# Patient Record
Sex: Male | Born: 1940 | Race: Black or African American | Hispanic: No | Marital: Married | Smoking: Never smoker
Health system: Southern US, Community
[De-identification: ages and names within clinical notes are randomized; demographics above are authoritative.]

## PROBLEM LIST (undated history)

## (undated) DIAGNOSIS — F419 Anxiety disorder, unspecified: Secondary | ICD-10-CM

## (undated) DIAGNOSIS — G629 Polyneuropathy, unspecified: Secondary | ICD-10-CM

## (undated) DIAGNOSIS — K219 Gastro-esophageal reflux disease without esophagitis: Secondary | ICD-10-CM

## (undated) DIAGNOSIS — H269 Unspecified cataract: Secondary | ICD-10-CM

## (undated) DIAGNOSIS — H6690 Otitis media, unspecified, unspecified ear: Secondary | ICD-10-CM

## (undated) DIAGNOSIS — Z860100 Personal history of colon polyps, unspecified: Secondary | ICD-10-CM

## (undated) DIAGNOSIS — E119 Type 2 diabetes mellitus without complications: Secondary | ICD-10-CM

## (undated) DIAGNOSIS — G4700 Insomnia, unspecified: Secondary | ICD-10-CM

## (undated) DIAGNOSIS — M1990 Unspecified osteoarthritis, unspecified site: Secondary | ICD-10-CM

## (undated) DIAGNOSIS — I10 Essential (primary) hypertension: Secondary | ICD-10-CM

## (undated) DIAGNOSIS — M2550 Pain in unspecified joint: Secondary | ICD-10-CM

## (undated) DIAGNOSIS — M2540 Effusion, unspecified joint: Secondary | ICD-10-CM

## (undated) DIAGNOSIS — E785 Hyperlipidemia, unspecified: Secondary | ICD-10-CM

## (undated) HISTORY — PX: OTHER SURGICAL HISTORY: SHX169

## (undated) HISTORY — PX: COLONOSCOPY WITH ESOPHAGOGASTRODUODENOSCOPY (EGD) AND ESOPHAGEAL DILATION (ED): SHX6495

## (undated) HISTORY — PX: COLONOSCOPY: SHX174

## (undated) HISTORY — PX: HERNIA REPAIR: SHX51

## (undated) HISTORY — PX: REPLACEMENT TOTAL KNEE: SUR1224

## (undated) HISTORY — PX: TONSILLECTOMY: SUR1361

---

## 2006-10-03 ENCOUNTER — Inpatient Hospital Stay (HOSPITAL_COMMUNITY): Admission: RE | Admit: 2006-10-03 | Discharge: 2006-10-06 | Payer: Self-pay | Admitting: Orthopedic Surgery

## 2006-11-28 ENCOUNTER — Encounter: Admission: RE | Admit: 2006-11-28 | Discharge: 2006-11-28 | Payer: Self-pay | Admitting: Gastroenterology

## 2008-09-17 ENCOUNTER — Emergency Department (HOSPITAL_COMMUNITY): Admission: EM | Admit: 2008-09-17 | Discharge: 2008-09-17 | Payer: Self-pay | Admitting: Emergency Medicine

## 2008-10-04 IMAGING — CR DG CHEST 1V PORT
1 series · 1 of 1 positions shown · non-contrast
Comparison: None.

Exam: Chest, one view.

HISTORY: Rule out heart failure.

[view not recorded]
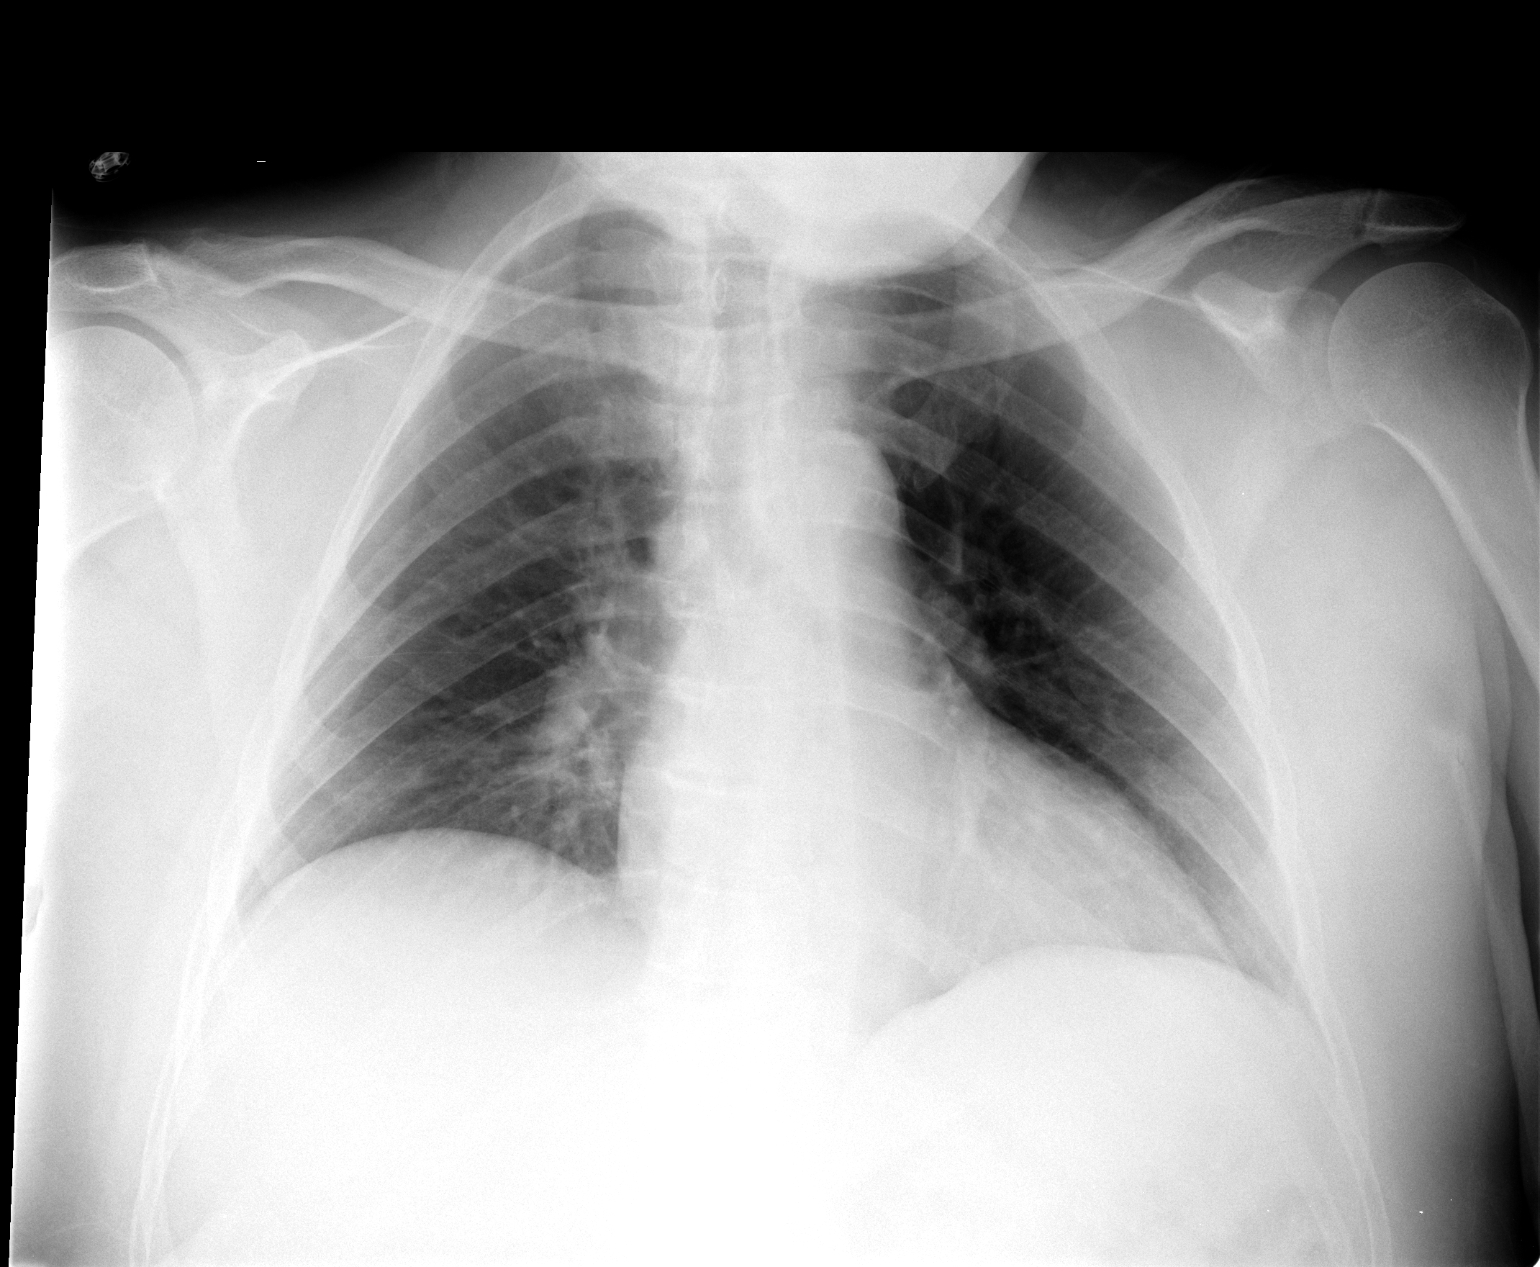

[1 of 1 positions shown; findings below may reference images not displayed]

FINDINGS: Heart size is normal.

There is no pleural fluid or pulmonary edema.

No airspace opacities are identified.
IMPRESSION: 1. No active disease.

## 2010-07-05 NOTE — H&P (Signed)
NAME:  Donald Hernandez, Donald Hernandez NO.:  [id number]   MEDICAL RECORD NO.:  [id number]         PATIENT TYPE:  LINP   LOCATION:                               FACILITY:  [facility]   PHYSICIAN:  Ollen Gross, M.D.    DATE OF BIRTH:  [birth year]   DATE OF ADMISSION:  10/03/2006  DATE OF DISCHARGE:                              HISTORY & PHYSICAL   CHIEF COMPLAINT:  Left knee pain and dysfunction.   HISTORY OF PRESENT ILLNESS:  This patient is a 70 year old male who has  been seen by Dr. Lequita Halt for ongoing left knee pain.  He has known  arthritis.  It is more dysfunction than it is pain.  The knee is quite  stiff, and it is preventing him from doing the things that he enjoys. He  has been on anti-inflammatories in the past.  He has also completed a  series of Synvisc; unfortunately did not get good results.  He has  reached a point where he would like to have something more permanent  done about his knee.  Risks and benefits of procedure have been  discussed with the patient, and he elects to proceed with surgery.   ALLERGIES:  No known drug allergies.   CURRENT MEDICATIONS:  Crestor, occasional BC powder.   PAST MEDICAL HISTORY:  Hypercholesterolemia, benign tumor of the  prostate, arthritis.   PAST SURGICAL HISTORY:  Past surgical history of hernia during his  childhood years, left fourth finger amputation surgery.  Also multiple  left finger surgeries with index of fifth finger skin grafting,  outpatient prostate surgery, also colonoscopy.   SOCIAL HISTORY:  Married, retired, nonsmoker.  No alcohol.  Four  children.   FAMILY HISTORY:  Father, mother with diabetes.  Family history of also  heart disease.   REVIEW OF SYSTEMS:  GENERAL:  No fevers, chills, night sweats.  NEUROLOGICAL:  No seizures, syncope or paralysis.  RESPIRATORY:  No  shortness of breath, productive cough or hemoptysis.  CARDIOVASCULAR:  She had no chest pain, angina, orthopnea.  GI:  No nausea,  vomiting,  diarrhea, constipation.  GU:  A little bit of weak stream, no dysuria or  hematuria.  MUSCULOSKELETAL:  Left knee.   PHYSICAL EXAMINATION:  VITAL SIGNS:  Pulse 68, respirations 12, blood  pressure 160/86.  GENERAL:  A 70 year old African-American male well-nourished, well-  developed in no acute distress.  Good historian.  He is accompanied by  his wife.  HEENT:  Normocephalic, atraumatic.  Pupils are round, reactive.  Oropharynx clear.  EOMs intact.  NECK:  Supple.  CHEST:  Clear.  HEART:  Regular rate and rhythm.  No murmur, S1, S2 noted.  ABDOMEN:  Soft, nontender.  Bowel sounds present.  BREASTS/GENITALIA:  Not done and not pertinent to present illness.  EXTREMITIES:  Left knee marked crepitus noted on passive range of  motion.  Range of motion 10-115, no instability.   IMPRESSION:  1. Osteoarthritis of left knee.  2. Hypercholesterolemia.  3. Benign tumor of the prostate.  4. Arthritis.   PLAN:  The patient admitted to [facility] to undergo  a left  total knee replacement arthroplasty.  Surgery will be performed by Dr.  Ollen Gross.      Alexzandrew L. Perkins, P.A.C.      Ollen Gross, M.D.  Electronically Signed    ALP/MEDQ  D:  09/25/2006  T:  09/25/2006  Job:  [id number]   cc:   Ollen Gross, M.D.  Fax: [phone]   Elana Alm. Nicholos Johns, M.D.  Fax: [phone]

## 2010-07-05 NOTE — Op Note (Signed)
NAME:  Donald Hernandez, GROUND NO.:  [id number]   MEDICAL RECORD NO.:  [id number]          PATIENT TYPE:  INP   LOCATION:  0004                         FACILITY:  [facility]   PHYSICIAN:  Ollen Gross, M.D.    DATE OF BIRTH:  [birth year]   DATE OF PROCEDURE:  10/03/2006  DATE OF DISCHARGE:                               OPERATIVE REPORT   PREOPERATIVE DIAGNOSIS:  Osteoarthritis left knee.   POSTOPERATIVE DIAGNOSIS:  Osteoarthritis left knee.   PROCEDURE:  Left total knee arthroplasty.   SURGEON:  Ollen Gross, M.D.   ASSISTANT:  Alexzandrew L. Perkins, P.A.C.   ANESTHESIA:  Spinal.   ESTIMATED BLOOD LOSS:  Minimal.   DRAINS:  None.   TOURNIQUET TIME:  36 minutes at 300 mmHg.   COMPLICATIONS:  None.   CONDITION:  Stable to recovery room.   CLINICAL NOTE:  Mr. Mcminn is a 70 year old male who has end stage  arthritis of his left knee with progressively worsening pain and  dysfunction.  He has failed nonoperative management including injections  and presents now for total knee arthroplasty.   PROCEDURE IN DETAIL:  After the successful administration of spinal  anesthetic, a tourniquet was placed high on the left thigh and the left  lower extremity was prepped and draped in the usual sterile fashion.  The extremity was wrapped in an Esmarch, knee flexed, tourniquet  inflated to 300 mmHg.  A midline incision was made with a 10 blade  through subcutaneous tissue to the level of the extensor mechanism.  A  fresh blade was used make a medial parapatellar arthrotomy.  Soft tissue  over the proximal medial tibia is subperiosteally elevated to the joint  line with the knife and into the semimembranosus bursa with a Cobb  elevator.  Soft tissue laterally is elevated with attention being paid  to avoid the patellar tendon on the tibial tubercle.  The patella was  subluxed laterally and knee flexed 90 degrees, ACL and PCL are removed.  A drill was used to create a starting  hole in the distal femur, the  canal was thoroughly irrigated.  A 5 degree left valgus alignment guide  is placed referencing off the posterior condyles. Rotation is marked and  the block pinned to remove 11 mm of the distal femur.  I took 11 mm  because of a preop flexion contracture.  Distal femoral resection is  made with an oscillating saw.  Sizing blocks is placed, size 4 is most  appropriate.  Rotation is marked off the epicondylar axis, size 4  cutting block is placed, and then the anterior, posterior, and chamfer  cuts were made.   The tibia was subluxed forward and the menisci were removed.  Extramedullary tibial alignment guide was placed referencing proximally  at the medial aspect of the tibial tubercle and distally along the  second metatarsal axis and tibial crest.  Blocks were pinned to remove  10 mm off the non-deficient lateral side.  This barely got anything off  the medial side so I took an additional 2 mm.  Tibial resection is made  with an  oscillating saw.  Size 4 is the most appropriate tibial  component and the proximal tibia is prepared with the modular drill and  keel punch for a size 4.  Femoral preparation is completed with the  intercondylar cut.   Size 4 mobile bearing tibial trial, size 4 posterior stabilized femoral  trial, and a 12.5 mm posterior stabilized rotating platform insert trial  are placed.  With the 12.5, full extension was achieved with excellent  varus and valgus balance throughout full range of motion.  The patella  was everted and thickness measured to be 22 mm.  Freehand resection was  taken to 33 mm, 38 template is placed, lug holes were drilled, trial  patella was placed and it tracks normally.  Osteophytes were removed off  the posterior femur with the trial in place.  All trials were removed  and the cut bone surfaces are prepared with pulsatile lavage.  The  cement was mixed and once ready for implantation, the size 3 mobile  bearing  tibial tray, size 3 posterior stabilized femur, and 38 patella  are cemented into place.  The patella was held with a clamp.  The trial  12.5 insert is placed, the knee held in full extension, and all extruded  cement removed.  While the cement was hardening, the synovium was  removed.  The wound was copiously irrigated with saline solution and  FloSeal placed on the posterior capsule as well as the medial and  lateral gutters and suprapatellar area.  The trial insert was removed  and the permanent 12.5 mm posterior stabilized rotating platform insert  is placed into the tibial tray.  The wound is copiously irrigated with  saline again and the tourniquet released for a total time of 36 minutes.  A moist sponge is placed and after two minutes removed, and there is  minimal if any bleeding.  Any bleeding encountered is stopped with  electrocautery.  The extensor mechanism is then closed with interrupted  #1 PDS.  Flexion against gravity to 135 degrees.  Subcu was closed with  interrupted 2-0 Vicryl, subcuticular running 4-0 Monocryl.  The incision  is cleaned and dried and Steri-Strips and a bulky sterile dressing  applied.  He was then placed into a knee immobilizer, awakened, and  transferred to recovery in stable condition.      Ollen Gross, M.D.  Electronically Signed     FA/MEDQ  D:  10/03/2006  T:  10/04/2006  Job:  [id number]

## 2010-07-08 NOTE — Discharge Summary (Signed)
NAME:  Donald Hernandez, Donald Hernandez NO.:  [id number]   MEDICAL RECORD NO.:  [id number]          PATIENT TYPE:  INP   LOCATION:  1617                         FACILITY:  [facility]   PHYSICIAN:  Ollen Gross, M.D.    DATE OF BIRTH:  18-Apr-1940   DATE OF ADMISSION:  10/03/2006  DATE OF DISCHARGE:  10/06/2006                               DISCHARGE SUMMARY   ADMISSION DIAGNOSES:  1. Osteoarthritis left knee.  2. Hypercholesterolemia.  3. Benign tumor the prostate.  4. Arthritis.   DISCHARGE DIAGNOSIS:  1. Osteoarthritis left knee, status post left total knee replacement      arthroplasty.  2. Mild postop blood loss anemia.  Did not require transfusion.  3. Hypercholesterolemia.  4. Benign tumor the prostate.  5. Arthritis.   PROCEDURE:  October 03, 2006 left total knee.  Surgeon Dr. Lequita Halt,  assistant Julien Girt PA-C.  Spinal anesthesia.   CONSULTATIONS:  None.   BRIEF HISTORY:  Donald Hernandez is a 70 year old male with end-stage  arthritis of left knee, with progressive worsening pain and dysfunction,  who has failed non-operative management  including injections, now  presents for total knee.   LABORATORY DATA:  Preop CBC showed hemoglobin 14.9, hematocrit of 44.2,  white cell count 6.6.  Postop hemoglobin 12.8, drifted down to 11.6.  Last H&H 10.7 and 31.5.  PT/PTT preop 12.9 and 29 respectively.  INR  1.0.  Serial protimes followed.  PT/INR 22.4, 1.9.  Chem panel on  admission all within normal limits with the exception of minimally  elevated ALP of 119.  Serial BMPs were followed.  Electrolytes remained  within normal limits.  Preop UA negative.  Postop UA:  Large hemoglobin,  moderate leukocyte esterase, only rare epithelials, 7-10 white cells, 36  red cells and few bacteria.  Urine culture no growth, negative.  EKG,  September 28, 2006 normal sinus rhythm, normal EKG, no previous tracings,  confirmed by Dr. Lewayne Bunting.   Chest X-Ray: October 04, 2006 no active disease.   HOSPITAL COURSE:  Admitted to [facility], tolerated procedure  well.  Later transferred to the orthopedic floor.  Started on PCA and  p.o. analgesics pain control following surgery and 24 hours postop IV  antibiotics.  Also started on Coumadin for DVT prophylaxis.   Actually doing pretty well on the morning of day one, hardly had any  pain.  He had a little bit of nausea and vomiting early a.m., but this  actually had resolved and doing better on morning rounds.  His urine  output was monitored.  He had decent output.  This was followed closely.  Hemoglobin looked good.  He started getting out of bed by day two.  Dressing was changed.  Incision looked good.  Output was excellent.  He  had had a little bit of elevated temperature, therefore chest x-ray and  UA was checked.  Chest x-ray did not show any active disease.  Blood  started some initial signs of a UTI, so we started on Cipro.  From a  therapy standpoint, he was doing well, walking about 150 feet.  By  day  three, he was doing well.  He still had a little bit of tinting and had  started on Cipro.  Encouraged incentive spirometer.  He was doing well  from a therapy standpoint, walking 300 feet.  He was on antibiotics for  the initial findings for UTI and was discharged home.   DISCHARGE/PLAN:  1. Discharged home on October 06, 2006.  2. Discharge diagnoses please see above.  3. Discharge meds:  Percocet, Robaxin, Cipro, Coumadin.  4. Diet:  Resume home diet, heart-healthy diet.  5. Activity:  Weightbearing as tolerated with home health PT, home      health nursing, total knee protocol.  Follow-up 2 weeks from      surgery with Dr. Lequita Halt   DISPOSITION:  Home.   CONDITION ON DISCHARGE:  Improving.      Donald Hernandez, P.A.C.      Ollen Gross, M.D.  Electronically Signed    ALP/MEDQ  D:  10/25/2006  T:  10/25/2006  Job:  [id number]   cc:   Ollen Gross, M.D.  Fax: [phone]

## 2010-12-02 LAB — CBC
HCT: 31.5 — ABNORMAL LOW
HCT: 33.3 — ABNORMAL LOW
Hemoglobin: 10.7 — ABNORMAL LOW
MCHC: 34
MCV: 85.8
Platelets: 190
RBC: 3.68 — ABNORMAL LOW
RBC: 3.88 — ABNORMAL LOW
RDW: 12.5

## 2010-12-02 LAB — BASIC METABOLIC PANEL
CO2: 26
Chloride: 107
Creatinine, Ser: 1.02
Glucose, Bld: 122 — ABNORMAL HIGH
Potassium: 4.1
Sodium: 139

## 2010-12-02 LAB — PROTIME-INR
INR: 1.9 — ABNORMAL HIGH
Prothrombin Time: 22.4 — ABNORMAL HIGH

## 2010-12-05 LAB — CBC
HCT: 37.6 — ABNORMAL LOW
HCT: 44.2
Hemoglobin: 12.8 — ABNORMAL LOW
Hemoglobin: 14.9
MCHC: 33.8
MCHC: 34.1
MCV: 85.8
Platelets: 216
Platelets: 244
RBC: 4.38
RBC: 5.18
RDW: 13.7
WBC: 6.6
WBC: 8.4

## 2010-12-05 LAB — COMPREHENSIVE METABOLIC PANEL
AST: 25
Albumin: 3.6
CO2: 27
GFR calc Af Amer: >60
GFR calc non Af Amer: 59 — ABNORMAL LOW
Sodium: 138
Total Bilirubin: 0.7

## 2010-12-05 LAB — PROTIME-INR
Prothrombin Time: 12.9
Prothrombin Time: 13.4

## 2010-12-05 LAB — URINALYSIS, ROUTINE W REFLEX MICROSCOPIC
Bilirubin Urine: NEGATIVE
Bilirubin Urine: NEGATIVE
Hgb urine dipstick: NEGATIVE
Ketones, ur: NEGATIVE
pH: 5.5

## 2010-12-05 LAB — URINE MICROSCOPIC-ADD ON

## 2010-12-05 LAB — URINE CULTURE: Culture: NO GROWTH

## 2010-12-05 LAB — TYPE AND SCREEN
ABO/RH(D): O POS
Antibody Screen: NEGATIVE

## 2010-12-05 LAB — BASIC METABOLIC PANEL
CO2: 26
Chloride: 103
GFR calc non Af Amer: >60
Potassium: 4.3

## 2014-01-05 ENCOUNTER — Other Ambulatory Visit (HOSPITAL_COMMUNITY): Payer: Self-pay | Admitting: Orthopedic Surgery

## 2014-01-21 ENCOUNTER — Encounter (HOSPITAL_COMMUNITY): Payer: Self-pay

## 2014-01-21 ENCOUNTER — Encounter (HOSPITAL_COMMUNITY)
Admission: RE | Admit: 2014-01-21 | Discharge: 2014-01-21 | Disposition: A | Discharge status: Home / Self Care | Payer: Medicare Other | Source: Ambulatory Visit | Attending: Orthopedic Surgery | Admitting: Orthopedic Surgery

## 2014-01-21 ENCOUNTER — Other Ambulatory Visit (HOSPITAL_COMMUNITY): Payer: Self-pay | Admitting: Orthopedic Surgery

## 2014-01-21 ENCOUNTER — Encounter (HOSPITAL_COMMUNITY): Payer: Self-pay | Admitting: Pharmacy Technician

## 2014-01-21 ENCOUNTER — Ambulatory Visit (HOSPITAL_COMMUNITY)
Admission: RE | Admit: 2014-01-21 | Discharge: 2014-01-21 | Disposition: A | Discharge status: Home / Self Care | Payer: Medicare Other | Source: Ambulatory Visit | Attending: Anesthesiology | Admitting: Anesthesiology

## 2014-01-21 VITALS — BP 146/43 | HR 40 | Temp 98.5°F | Resp 20 | Ht 69.0 in | Wt 198.4 lb

## 2014-01-21 DIAGNOSIS — Z01810 Encounter for preprocedural cardiovascular examination: Secondary | ICD-10-CM | POA: Diagnosis not present

## 2014-01-21 DIAGNOSIS — Z01818 Encounter for other preprocedural examination: Secondary | ICD-10-CM | POA: Diagnosis present

## 2014-01-21 DIAGNOSIS — I10 Essential (primary) hypertension: Secondary | ICD-10-CM | POA: Diagnosis not present

## 2014-01-21 HISTORY — DX: Pain in unspecified joint: M25.50

## 2014-01-21 HISTORY — DX: Gastro-esophageal reflux disease without esophagitis: K21.9

## 2014-01-21 HISTORY — DX: Personal history of colonic polyps: Z86.010

## 2014-01-21 HISTORY — DX: Unspecified osteoarthritis, unspecified site: M19.90

## 2014-01-21 HISTORY — DX: Essential (primary) hypertension: I10

## 2014-01-21 HISTORY — DX: Personal history of colon polyps, unspecified: Z86.0100

## 2014-01-21 HISTORY — DX: Insomnia, unspecified: G47.00

## 2014-01-21 HISTORY — DX: Anxiety disorder, unspecified: F41.9

## 2014-01-21 HISTORY — DX: Type 2 diabetes mellitus without complications: E11.9

## 2014-01-21 HISTORY — DX: Hyperlipidemia, unspecified: E78.5

## 2014-01-21 HISTORY — DX: Effusion, unspecified joint: M25.40

## 2014-01-21 HISTORY — DX: Polyneuropathy, unspecified: G62.9

## 2014-01-21 HISTORY — DX: Unspecified cataract: H26.9

## 2014-01-21 HISTORY — DX: Otitis media, unspecified, unspecified ear: H66.90

## 2014-01-21 LAB — TYPE AND SCREEN
ABO/RH(D): O POS
Antibody Screen: NEGATIVE

## 2014-01-21 LAB — BASIC METABOLIC PANEL
Anion gap: 13 (ref 5–15)
BUN: 21 mg/dL (ref 6–23)
CHLORIDE: 106 meq/L (ref 96–112)
CO2: 25 mEq/L (ref 19–32)
CREATININE: 1.1 mg/dL (ref 0.50–1.35)
Calcium: 9.7 mg/dL (ref 8.4–10.5)
GFR calc non Af Amer: 65 mL/min — ABNORMAL LOW (ref >90)
GFR, EST AFRICAN AMERICAN: 75 mL/min — AB (ref >90)
Glucose, Bld: 113 mg/dL — ABNORMAL HIGH (ref 70–99)
Potassium: 4 mEq/L (ref 3.7–5.3)
Sodium: 144 mEq/L (ref 137–147)

## 2014-01-21 LAB — CBC
HEMATOCRIT: 39.2 % (ref 39.0–52.0)
HEMOGLOBIN: 13 g/dL (ref 13.0–17.0)
MCH: 28.4 pg (ref 26.0–34.0)
MCHC: 33.2 g/dL (ref 30.0–36.0)
MCV: 85.6 fL (ref 78.0–100.0)
Platelets: 275 10*3/uL (ref 150–400)
RBC: 4.58 MIL/uL (ref 4.22–5.81)
RDW: 12.8 % (ref 11.5–15.5)
WBC: 5.4 10*3/uL (ref 4.0–10.5)

## 2014-01-21 LAB — APTT: aPTT: 32 seconds (ref 24–37)

## 2014-01-21 LAB — PROTIME-INR
INR: 0.99 (ref 0.00–1.49)
PROTHROMBIN TIME: 13.2 s (ref 11.6–15.2)

## 2014-01-21 LAB — SURGICAL PCR SCREEN
MRSA, PCR: NEGATIVE
Staphylococcus aureus: NEGATIVE

## 2014-01-21 LAB — ABO/RH: ABO/RH(D): O POS

## 2014-01-21 NOTE — Progress Notes (Signed)
Pt doesn't have a cardiologist  [facility] [place] [facility]   [facility] [place] done [place] 67[facility]yrs ago  Denies ever having a heart cath  Denies EKG or CXR in past yr  [place]

## 2014-01-21 NOTE — Pre-Procedure Instructions (Signed)
Donald AlmRobert S Hernandez  01/21/2014   Your procedure is scheduled on:  Tues, Dec 8 @ 10:30 AM  [facility]  at 8:30 AM.  Call this number if you have problems the morning of surgery: [phone]   Remember:   Do not eat food or drink liquids after midnight.   Take these medicines the morning of surgery with A SIP OF WATER: Gabapentin(Neurontin) and Omeprazole(Prilosec)              No Goody's,BC's,Aleve,Aspirin,Ibuprofen,Fish Oil,or any Herbal Medications   Do not wear jewelry.  Do not wear lotions, powders, or colognes.   Men may shave face and neck.  Do not bring valuables to the hospital.  [facility]                  for any belongings or valuables.               Contacts, dentures or bridgework may not be worn into surgery.  Leave suitcase in the car. After surgery it may be brought to your room.  For patients admitted to the hospital, discharge time is determined by your                treatment team.                  Special Instructions:  [place] - Preparing for Surgery  Before surgery, you can play an important role.  Because skin is not sterile, your skin needs to be as free of germs as possible.  You can reduce the number of germs on you skin by washing with CHG (chlorahexidine gluconate) soap before surgery.  CHG is an antiseptic cleaner which kills germs and bonds with the skin to continue killing germs even after washing.  Please DO NOT use if you have an allergy to CHG or antibacterial soaps.  If your skin becomes reddened/irritated stop using the CHG and inform your nurse when you arrive at Short Stay.  Do not shave (including legs and underarms) for at least 48 hours prior to the first CHG shower.  You may shave your face.  Please follow these instructions carefully:   1.  Shower with CHG Soap the night before surgery and the                                morning of Surgery.  2.  If you choose to wash your hair, wash your  hair first as usual with your       normal shampoo.  3.  After you shampoo, rinse your hair and body thoroughly to remove the                      Shampoo.  4.  Use CHG as you would any other liquid soap.  You can apply chg directly       to the skin and wash gently with scrungie or a clean washcloth.  5.  Apply the CHG Soap to your body ONLY FROM THE NECK DOWN.        Do not use on open wounds or open sores.  Avoid contact with your eyes,       ears, mouth and genitals (private parts).  Wash genitals (private parts)       with your normal soap.  6.  Wash thoroughly, paying special attention to  the area where your surgery        will be performed.  7.  Thoroughly rinse your body with warm water from the neck down.  8.  DO NOT shower/wash with your normal soap after using and rinsing off       the CHG Soap.  9.  Pat yourself dry with a clean towel.            10.  Wear clean pajamas.            11.  Place clean sheets on your bed the night of your first shower and do not        sleep with pets.  Day of Surgery  Do not apply any lotions/deoderants the morning of surgery.  Please wear clean clothes to the hospital/surgery center.     Please read over the following fact sheets that you were given: Pain Booklet, Coughing and Deep Breathing, Blood Transfusion Information, MRSA Information and Surgical Site Infection Prevention

## 2014-01-21 NOTE — Progress Notes (Signed)
Requested orders from Dr.Dean and also notified him of pt completing an antibiotic d/t recent ear infection

## 2014-01-26 MED ORDER — CEFAZOLIN SODIUM-DEXTROSE 2-3 GM-% IV SOLR
2.0000 g | INTRAVENOUS | Status: AC
  Administered 2014-01-27 (×2): 2 g via INTRAVENOUS
  Filled 2014-01-26: qty 50

## 2014-01-27 ENCOUNTER — Inpatient Hospital Stay (HOSPITAL_COMMUNITY): Payer: Medicare Other | Admitting: Anesthesiology

## 2014-01-27 ENCOUNTER — Inpatient Hospital Stay (HOSPITAL_COMMUNITY)
Admission: RE | Admit: 2014-01-27 | Discharge: 2014-01-28 | DRG: 483 | Disposition: A | Discharge status: Home / Self Care | Payer: Medicare Other | Source: Ambulatory Visit | Attending: Orthopedic Surgery | Admitting: Orthopedic Surgery

## 2014-01-27 ENCOUNTER — Encounter (HOSPITAL_COMMUNITY)
Admission: RE | Disposition: A | Discharge status: Home / Self Care | Payer: Self-pay | Source: Ambulatory Visit | Attending: Orthopedic Surgery

## 2014-01-27 ENCOUNTER — Encounter (HOSPITAL_COMMUNITY): Payer: Self-pay | Admitting: *Deleted

## 2014-01-27 DIAGNOSIS — E785 Hyperlipidemia, unspecified: Secondary | ICD-10-CM | POA: Diagnosis not present

## 2014-01-27 DIAGNOSIS — I10 Essential (primary) hypertension: Secondary | ICD-10-CM | POA: Diagnosis not present

## 2014-01-27 DIAGNOSIS — K219 Gastro-esophageal reflux disease without esophagitis: Secondary | ICD-10-CM | POA: Diagnosis present

## 2014-01-27 DIAGNOSIS — M19011 Primary osteoarthritis, right shoulder: Secondary | ICD-10-CM | POA: Diagnosis not present

## 2014-01-27 DIAGNOSIS — E119 Type 2 diabetes mellitus without complications: Secondary | ICD-10-CM | POA: Diagnosis not present

## 2014-01-27 DIAGNOSIS — F4310 Post-traumatic stress disorder, unspecified: Secondary | ICD-10-CM | POA: Diagnosis present

## 2014-01-27 DIAGNOSIS — M19019 Primary osteoarthritis, unspecified shoulder: Secondary | ICD-10-CM | POA: Diagnosis present

## 2014-01-27 DIAGNOSIS — Z86010 Personal history of colonic polyps: Secondary | ICD-10-CM

## 2014-01-27 DIAGNOSIS — M25511 Pain in right shoulder: Secondary | ICD-10-CM | POA: Diagnosis present

## 2014-01-27 HISTORY — PX: TOTAL SHOULDER ARTHROPLASTY: SHX126

## 2014-01-27 LAB — GLUCOSE, CAPILLARY
Glucose-Capillary: 83 mg/dL (ref 70–99)
Glucose-Capillary: 93 mg/dL (ref 70–99)

## 2014-01-27 LAB — URINALYSIS, ROUTINE W REFLEX MICROSCOPIC
Bilirubin Urine: NEGATIVE
Glucose, UA: NEGATIVE mg/dL
Hgb urine dipstick: NEGATIVE
Ketones, ur: NEGATIVE mg/dL
LEUKOCYTES UA: NEGATIVE
Nitrite: NEGATIVE
PROTEIN: NEGATIVE mg/dL
Specific Gravity, Urine: 1.019 (ref 1.005–1.030)
UROBILINOGEN UA: 0.2 mg/dL (ref 0.0–1.0)
pH: 5 (ref 5.0–8.0)

## 2014-01-27 SURGERY — ARTHROPLASTY, SHOULDER, TOTAL
Anesthesia: Regional | Site: Shoulder | Laterality: Right

## 2014-01-27 MED ORDER — PHENYLEPHRINE HCL 10 MG/ML IJ SOLN
INTRAMUSCULAR | Status: AC
  Filled 2014-01-27: qty 1

## 2014-01-27 MED ORDER — LOSARTAN POTASSIUM 50 MG PO TABS
100.0000 mg | ORAL_TABLET | Freq: Every day | ORAL | Status: DC
  Administered 2014-01-28 (×2): 100 mg via ORAL
  Filled 2014-01-27 (×3): qty 2

## 2014-01-27 MED ORDER — POTASSIUM CHLORIDE IN NACL 20-0.9 MEQ/L-% IV SOLN
INTRAVENOUS | Status: DC
  Administered 2014-01-28: 1000 mL via INTRAVENOUS
  Filled 2014-01-27: qty 1000

## 2014-01-27 MED ORDER — LIDOCAINE HCL (CARDIAC) 20 MG/ML IV SOLN
INTRAVENOUS | Status: DC | PRN
  Administered 2014-01-27: 80 mg via INTRAVENOUS

## 2014-01-27 MED ORDER — ONDANSETRON HCL 4 MG/2ML IJ SOLN: 4.0000 mg | Freq: Four times a day (QID) | INTRAMUSCULAR | Status: DC | PRN

## 2014-01-27 MED ORDER — FENTANYL CITRATE 0.05 MG/ML IJ SOLN
50.0000 ug | INTRAMUSCULAR | Status: DC | PRN
  Administered 2014-01-27: 50 ug via INTRAVENOUS
  Filled 2014-01-27: qty 2

## 2014-01-27 MED ORDER — SODIUM CHLORIDE 0.9 % IV SOLN
10.0000 mg | INTRAVENOUS | Status: DC | PRN
  Administered 2014-01-27: 25 ug/min via INTRAVENOUS

## 2014-01-27 MED ORDER — SODIUM CHLORIDE 0.9 % IR SOLN
Status: DC | PRN
  Administered 2014-01-27 (×8): 1000 mL

## 2014-01-27 MED ORDER — ACETAMINOPHEN 650 MG RE SUPP: 650.0000 mg | Freq: Four times a day (QID) | RECTAL | Status: DC | PRN

## 2014-01-27 MED ORDER — PROPOFOL 10 MG/ML IV BOLUS
INTRAVENOUS | Status: AC
  Filled 2014-01-27: qty 20

## 2014-01-27 MED ORDER — BUPIVACAINE-EPINEPHRINE (PF) 0.5% -1:200000 IJ SOLN
INTRAMUSCULAR | Status: DC | PRN
  Administered 2014-01-27: 30 mL via PERINEURAL

## 2014-01-27 MED ORDER — ROCURONIUM BROMIDE 100 MG/10ML IV SOLN
INTRAVENOUS | Status: DC | PRN
  Administered 2014-01-27: 10 mg via INTRAVENOUS
  Administered 2014-01-27: 50 mg via INTRAVENOUS
  Administered 2014-01-27: 10 mg via INTRAVENOUS

## 2014-01-27 MED ORDER — NEOSTIGMINE METHYLSULFATE 10 MG/10ML IV SOLN
INTRAVENOUS | Status: DC | PRN
  Administered 2014-01-27: 4 mg via INTRAVENOUS

## 2014-01-27 MED ORDER — OXYCODONE HCL 5 MG PO TABS
5.0000 mg | ORAL_TABLET | ORAL | Status: DC | PRN
  Administered 2014-01-28 (×2): 5 mg via ORAL
  Filled 2014-01-27 (×2): qty 1

## 2014-01-27 MED ORDER — LACTATED RINGERS IV SOLN
INTRAVENOUS | Status: DC | PRN
  Administered 2014-01-27 (×3): via INTRAVENOUS

## 2014-01-27 MED ORDER — CHLORHEXIDINE GLUCONATE 4 % EX LIQD
60.0000 mL | Freq: Once | CUTANEOUS | Status: DC
  Filled 2014-01-27: qty 60

## 2014-01-27 MED ORDER — ROCURONIUM BROMIDE 50 MG/5ML IV SOLN
INTRAVENOUS | Status: AC
  Filled 2014-01-27: qty 1

## 2014-01-27 MED ORDER — GABAPENTIN 100 MG PO CAPS
100.0000 mg | ORAL_CAPSULE | Freq: Every day | ORAL | Status: DC
  Administered 2014-01-27: 100 mg via ORAL
  Filled 2014-01-27: qty 1

## 2014-01-27 MED ORDER — ETODOLAC 300 MG PO CAPS
300.0000 mg | ORAL_CAPSULE | Freq: Two times a day (BID) | ORAL | Status: DC
  Administered 2014-01-27 – 2014-01-28 (×2): 300 mg via ORAL
  Filled 2014-01-27 (×3): qty 1

## 2014-01-27 MED ORDER — ONDANSETRON HCL 4 MG/2ML IJ SOLN
INTRAMUSCULAR | Status: DC | PRN
  Administered 2014-01-27: 4 mg via INTRAVENOUS

## 2014-01-27 MED ORDER — ASPIRIN 325 MG PO TABS
325.0000 mg | ORAL_TABLET | Freq: Every day | ORAL | Status: DC
  Administered 2014-01-27 – 2014-01-28 (×2): 325 mg via ORAL
  Filled 2014-01-27 (×2): qty 1

## 2014-01-27 MED ORDER — GLYCOPYRROLATE 0.2 MG/ML IJ SOLN
INTRAMUSCULAR | Status: AC
  Filled 2014-01-27: qty 3

## 2014-01-27 MED ORDER — CEFAZOLIN SODIUM-DEXTROSE 2-3 GM-% IV SOLR
2.0000 g | Freq: Three times a day (TID) | INTRAVENOUS | Status: AC
  Administered 2014-01-27 – 2014-01-28 (×2): 2 g via INTRAVENOUS
  Filled 2014-01-27 (×2): qty 50

## 2014-01-27 MED ORDER — LACTATED RINGERS IV SOLN
INTRAVENOUS | Status: DC
  Administered 2014-01-27: 10:00:00 via INTRAVENOUS

## 2014-01-27 MED ORDER — MENTHOL 3 MG MT LOZG: 1.0000 | LOZENGE | OROMUCOSAL | Status: DC | PRN

## 2014-01-27 MED ORDER — METOCLOPRAMIDE HCL 5 MG/ML IJ SOLN: 5.0000 mg | Freq: Three times a day (TID) | INTRAMUSCULAR | Status: DC | PRN

## 2014-01-27 MED ORDER — METOCLOPRAMIDE HCL 5 MG PO TABS: 5.0000 mg | ORAL_TABLET | Freq: Three times a day (TID) | ORAL | Status: DC | PRN

## 2014-01-27 MED ORDER — OXYCODONE HCL 5 MG PO TABS: 5.0000 mg | ORAL_TABLET | Freq: Once | ORAL | Status: DC | PRN

## 2014-01-27 MED ORDER — VITAMIN D 1000 UNITS PO TABS
2000.0000 [IU] | ORAL_TABLET | Freq: Every day | ORAL | Status: DC
  Administered 2014-01-27 – 2014-01-28 (×2): 2000 [IU] via ORAL
  Filled 2014-01-27 (×2): qty 2

## 2014-01-27 MED ORDER — ACETAMINOPHEN 325 MG PO TABS: 650.0000 mg | ORAL_TABLET | Freq: Four times a day (QID) | ORAL | Status: DC | PRN

## 2014-01-27 MED ORDER — LIDOCAINE HCL (CARDIAC) 20 MG/ML IV SOLN
INTRAVENOUS | Status: AC
  Filled 2014-01-27: qty 5

## 2014-01-27 MED ORDER — ARTIFICIAL TEARS OP OINT
TOPICAL_OINTMENT | OPHTHALMIC | Status: AC
  Filled 2014-01-27: qty 3.5

## 2014-01-27 MED ORDER — PANTOPRAZOLE SODIUM 40 MG PO TBEC
40.0000 mg | DELAYED_RELEASE_TABLET | Freq: Every day | ORAL | Status: DC
  Administered 2014-01-27 – 2014-01-28 (×2): 40 mg via ORAL
  Filled 2014-01-27 (×2): qty 1

## 2014-01-27 MED ORDER — ONDANSETRON HCL 4 MG/2ML IJ SOLN
INTRAMUSCULAR | Status: AC
  Filled 2014-01-27: qty 2

## 2014-01-27 MED ORDER — ALBUMIN HUMAN 5 % IV SOLN
INTRAVENOUS | Status: DC | PRN
  Administered 2014-01-27 (×2): via INTRAVENOUS

## 2014-01-27 MED ORDER — GLYCOPYRROLATE 0.2 MG/ML IJ SOLN
INTRAMUSCULAR | Status: DC | PRN
  Administered 2014-01-27: 0.6 mg via INTRAVENOUS

## 2014-01-27 MED ORDER — ARTIFICIAL TEARS OP OINT
TOPICAL_OINTMENT | OPHTHALMIC | Status: DC | PRN
  Administered 2014-01-27: 1 via OPHTHALMIC

## 2014-01-27 MED ORDER — METFORMIN HCL 500 MG PO TABS: 250.0000 mg | ORAL_TABLET | Freq: Every day | ORAL | Status: DC | PRN

## 2014-01-27 MED ORDER — ATORVASTATIN CALCIUM 40 MG PO TABS: 40.0000 mg | ORAL_TABLET | Freq: Every day | ORAL | Status: DC

## 2014-01-27 MED ORDER — MORPHINE SULFATE 2 MG/ML IJ SOLN
1.0000 mg | INTRAMUSCULAR | Status: DC | PRN
Start: 2014-01-27 — End: 2014-01-28

## 2014-01-27 MED ORDER — PROPOFOL 10 MG/ML IV BOLUS
INTRAVENOUS | Status: DC | PRN
  Administered 2014-01-27: 150 mg via INTRAVENOUS

## 2014-01-27 MED ORDER — ONDANSETRON HCL 4 MG PO TABS: 4.0000 mg | ORAL_TABLET | Freq: Four times a day (QID) | ORAL | Status: DC | PRN

## 2014-01-27 MED ORDER — PHENOL 1.4 % MT LIQD: 1.0000 | OROMUCOSAL | Status: DC | PRN

## 2014-01-27 MED ORDER — MIDAZOLAM HCL 2 MG/2ML IJ SOLN
1.0000 mg | INTRAMUSCULAR | Status: DC | PRN
  Administered 2014-01-27: 2 mg via INTRAVENOUS
  Filled 2014-01-27: qty 2

## 2014-01-27 MED ORDER — FENTANYL CITRATE 0.05 MG/ML IJ SOLN
INTRAMUSCULAR | Status: AC
  Filled 2014-01-27: qty 5

## 2014-01-27 MED ORDER — DEXAMETHASONE SODIUM PHOSPHATE 10 MG/ML IJ SOLN
INTRAMUSCULAR | Status: DC | PRN
  Administered 2014-01-27: 10 mg via INTRAVENOUS

## 2014-01-27 MED ORDER — FENTANYL CITRATE 0.05 MG/ML IJ SOLN
INTRAMUSCULAR | Status: DC | PRN
  Administered 2014-01-27 (×3): 50 ug via INTRAVENOUS

## 2014-01-27 MED ORDER — PHENYLEPHRINE HCL 10 MG/ML IJ SOLN
INTRAMUSCULAR | Status: DC | PRN
  Administered 2014-01-27 (×2): 40 ug via INTRAVENOUS
  Administered 2014-01-27: 80 ug via INTRAVENOUS

## 2014-01-27 MED ORDER — OXYCODONE HCL 5 MG/5ML PO SOLN: 5.0000 mg | Freq: Once | ORAL | Status: DC | PRN

## 2014-01-27 MED ORDER — FENTANYL CITRATE 0.05 MG/ML IJ SOLN: 25.0000 ug | INTRAMUSCULAR | Status: DC | PRN

## 2014-01-27 SURGICAL SUPPLY — 63 items
BLADE SAW SGTL 13X75X1.27 (BLADE) ×3 IMPLANT
BONE CEMENT PALACOSE (Orthopedic Implant) ×3 IMPLANT
CAPT SHLDR TOTAL 2 ×3 IMPLANT
CEMENT BONE PALACOSE (Orthopedic Implant) IMPLANT
COVER SURGICAL LIGHT HANDLE (MISCELLANEOUS) ×3 IMPLANT
COVER TABLE BACK 60X90 (DRAPES) IMPLANT
DRAPE C-ARM 42X72 X-RAY (DRAPES) IMPLANT
DRAPE IMP U-DRAPE 54X76 (DRAPES) ×3 IMPLANT
DRAPE INCISE IOBAN 66X45 STRL (DRAPES) ×6 IMPLANT
DRAPE U-SHAPE 47X51 STRL (DRAPES) ×6 IMPLANT
DRSG AQUACEL AG ADV 3.5X10 (GAUZE/BANDAGES/DRESSINGS) ×3 IMPLANT
DRSG PAD ABDOMINAL 8X10 ST (GAUZE/BANDAGES/DRESSINGS) ×2 IMPLANT
DURAPREP 26ML APPLICATOR (WOUND CARE) ×3 IMPLANT
ELECT BLADE 6.5 EXT (BLADE) ×2 IMPLANT
ELECT REM PT RETURN 9FT ADLT (ELECTROSURGICAL) ×3
ELECTRODE REM PT RTRN 9FT ADLT (ELECTROSURGICAL) ×1 IMPLANT
EVACUATOR 1/8 PVC DRAIN (DRAIN) IMPLANT
GAUZE SPONGE 4X4 12PLY STRL (GAUZE/BANDAGES/DRESSINGS) ×3 IMPLANT
GLOVE BIOGEL PI IND STRL 7.5 (GLOVE) ×1 IMPLANT
GLOVE BIOGEL PI IND STRL 8 (GLOVE) ×1 IMPLANT
GLOVE BIOGEL PI INDICATOR 7.5 (GLOVE) ×2
GLOVE BIOGEL PI INDICATOR 8 (GLOVE) ×2
GLOVE ECLIPSE 7.0 STRL STRAW (GLOVE) ×3 IMPLANT
GLOVE SURG ORTHO 8.0 STRL STRW (GLOVE) ×3 IMPLANT
GOWN STRL REUS W/ TWL LRG LVL3 (GOWN DISPOSABLE) ×2 IMPLANT
GOWN STRL REUS W/ TWL XL LVL3 (GOWN DISPOSABLE) ×1 IMPLANT
GOWN STRL REUS W/TWL LRG LVL3 (GOWN DISPOSABLE) ×6
GOWN STRL REUS W/TWL XL LVL3 (GOWN DISPOSABLE) ×3
IMMOBILIZER SHOULDER FOAM XLGE (SOFTGOODS) ×2 IMPLANT
KIT BASIN OR (CUSTOM PROCEDURE TRAY) ×3 IMPLANT
KIT ROOM TURNOVER OR (KITS) ×3 IMPLANT
MANIFOLD NEPTUNE II (INSTRUMENTS) ×3 IMPLANT
NDL HYPO 25GX1X1/2 BEV (NEEDLE) IMPLANT
NDL SUT 6 .5 CRC .975X.05 MAYO (NEEDLE) ×1 IMPLANT
NEEDLE HYPO 25GX1X1/2 BEV (NEEDLE) IMPLANT
NEEDLE MAYO TAPER (NEEDLE) ×3
NS IRRIG 1000ML POUR BTL (IV SOLUTION) ×3 IMPLANT
PACK SHOULDER (CUSTOM PROCEDURE TRAY) ×3 IMPLANT
PACK UNIVERSAL I (CUSTOM PROCEDURE TRAY) ×3 IMPLANT
PAD ARMBOARD 7.5X6 YLW CONV (MISCELLANEOUS) ×6 IMPLANT
PASSER SUT SWANSON 36MM LOOP (INSTRUMENTS) ×2 IMPLANT
PIN HUMERAL STMN 3.2MMX9IN (INSTRUMENTS) ×2 IMPLANT
SHOULDER CAPITATED TOTAL 2 IMPLANT
SPONGE LAP 18X18 X RAY DECT (DISPOSABLE) ×3 IMPLANT
SUCTION FRAZIER TIP 10 FR DISP (SUCTIONS) ×3 IMPLANT
SUT FIBERWIRE #2 38 T-5 BLUE (SUTURE) ×3
SUT MAXBRAID (SUTURE) ×12 IMPLANT
SUT PROLENE 3 0 PS 2 (SUTURE) ×3 IMPLANT
SUT SILK 2 0 (SUTURE) ×3
SUT SILK 2-0 18XBRD TIE 12 (SUTURE) IMPLANT
SUT VIC AB 0 CTB1 27 (SUTURE) ×6 IMPLANT
SUT VIC AB 0 CTX 36 (SUTURE) ×3
SUT VIC AB 0 CTX36XBRD ANTBCTR (SUTURE) IMPLANT
SUT VIC AB 1 CT1 27 (SUTURE) ×9
SUT VIC AB 1 CT1 27XBRD ANBCTR (SUTURE) ×1 IMPLANT
SUT VIC AB 2-0 CT1 27 (SUTURE) ×9
SUT VIC AB 2-0 CT1 TAPERPNT 27 (SUTURE) ×3 IMPLANT
SUTURE FIBERWR #2 38 T-5 BLUE (SUTURE) ×1 IMPLANT
SYR CONTROL 10ML LL (SYRINGE) ×3 IMPLANT
TOWEL OR 17X24 6PK STRL BLUE (TOWEL DISPOSABLE) ×3 IMPLANT
TOWEL OR 17X26 10 PK STRL BLUE (TOWEL DISPOSABLE) ×3 IMPLANT
TRAY FOLEY BAG SILVER LF 16FR (CATHETERS) ×2 IMPLANT
WATER STERILE IRR 1000ML POUR (IV SOLUTION) ×1 IMPLANT

## 2014-01-27 NOTE — Transfer of Care (Signed)
Immediate Anesthesia Transfer of Care Note  Patient: Donald Hernandez  Procedure(s) Performed: Procedure(s): RIGHT TOTAL SHOULDER ARTHROPLASTY (Right)  Patient Location: PACU  Anesthesia Type:General  Level of Consciousness: awake and alert   Airway & Oxygen Therapy: Patient Spontanous Breathing  Post-op Assessment: Report given to PACU RN and Post -op Vital signs reviewed and stable  Post vital signs: Reviewed and stable  Complications: No apparent anesthesia complications

## 2014-01-27 NOTE — Brief Op Note (Signed)
01/27/2014  5:27 PM  PATIENT:  Donald Hernandez  73 y.o. male  PRE-OPERATIVE DIAGNOSIS:  RIGHT SHOULDER OSTEOARTHRITIS  POST-OPERATIVE DIAGNOSIS:  RIGHT SHOULDER OSTEOARTHRITIS  PROCEDURE:  Procedure(s): RIGHT TOTAL SHOULDER ARTHROPLASTY  SURGEON:  Surgeon(s): Cammy CopaGregory Scott Dean, MD  ASSISTANT: carla bethune rnfa  ANESTHESIA:   general  EBL: 200 ml    Total I/O In: 2500 [I.V.:2000; IV Piggyback:500] Out: 475 [Urine:300; Blood:175]  BLOOD ADMINISTERED: none  DRAINS: none   LOCAL MEDICATIONS USED:  none  SPECIMEN:  No Specimen  COUNTS:  YES  TOURNIQUET:  * No tourniquets in log *  DICTATION: .Other Dictation: Dictation Number [id number]  PLAN OF CARE: Admit to inpatient   PATIENT DISPOSITION:  PACU - hemodynamically stable

## 2014-01-27 NOTE — Anesthesia Postprocedure Evaluation (Signed)
Anesthesia Post Note  Patient: Donald AlmRobert S Nethery  Procedure(s) Performed: Procedure(s) (LRB): RIGHT TOTAL SHOULDER ARTHROPLASTY (Right)  Anesthesia type: General  Patient location: PACU  Post pain: Pain level controlled and Adequate analgesia  Post assessment: Post-op Vital signs reviewed, Patient's Cardiovascular Status Stable, Respiratory Function Stable, Patent Airway and Pain level controlled  Last Vitals:  Filed Vitals:   01/27/14 1737  BP: 161/128  Pulse: 82  Temp: 36.4 C  Resp: 18    Post vital signs: Reviewed and stable  Level of consciousness: awake, alert  and oriented  Complications: No apparent anesthesia complications

## 2014-01-27 NOTE — H&P (Signed)
Donald AlmRobert S Hernandez is an 73 y.o. male.   Chief Complaint: Right shoulder pain HPI: Donald MaduroRobert is a 73 year old patient with right shoulder pain. Describes long history of right shoulder pain which is been worsening recently to include night pain pain with rest and pain with activities of daily living patient describes no relief from over-the-counter medications as well as therapeutic exercise. No family history of DVT or pulmonary embolism  Past Medical History  Diagnosis Date  . Hypertension     takes Losartan daily  . Hyperlipidemia     takes SImvastatin daily  . Ear infection 2 wks ago    treated with Amoxicillin;completed Amoxicillin 01/19/14  . Joint pain   . Joint swelling   . Arthritis   . GERD (gastroesophageal reflux disease)     takes Omeprazole daily  . History of colon polyps     benign  . Diabetes mellitus without complication     takes Metformin daily  . Peripheral neuropathy     takes Gabapentin daily  . Cataracts, bilateral     immature  . Anxiety     PTSD-but not on any meds  . Insomnia     doesn't take any meds    Past Surgical History  Procedure Laterality Date  . Hernia repair      as a child  . Skull surgery      as a child d/t fractured skull  . Left ring finger amputated    . I&d of abdomen      d/t splinter getting in abdomen  . Partial prostatectomy    . Replacement total knee Left   . Right eye surgery      d/t retinal detachment  . Tonsillectomy    . Colonoscopy with esophagogastroduodenoscopy (egd) and esophageal dilation (ed)    . Colonoscopy      No family history on file. Social History:  reports that he has never smoked. He does not have any smokeless tobacco history on file. He reports that he does not drink alcohol or use illicit drugs.  Allergies: No Known Allergies  No prescriptions prior to admission    No results found for this or any previous visit (from the past 48 hour(s)). No results found.  Review of Systems   Constitutional: Negative.   HENT: Negative.   Eyes: Negative.   Respiratory: Negative.   Cardiovascular: Negative.   Gastrointestinal: Negative.   Genitourinary: Negative.   Musculoskeletal: Positive for joint pain.  Skin: Negative.   Neurological: Negative.   Endo/Heme/Allergies: Negative.   Psychiatric/Behavioral: Negative.     There were no vitals taken for this visit. Physical Exam  Constitutional: He appears well-developed.  HENT:  Head: Normocephalic.  Eyes: Pupils are equal, round, and reactive to light.  Neck: Normal range of motion.  Cardiovascular: Normal rate.   Respiratory: Effort normal.  Neurological: He is alert.  Skin: Skin is warm.  Psychiatric: He has a normal mood and affect.   examination of the right shoulder demonstrates intact skin good motor sensory function in the right upper extremity x-ray rotation 15 abduction is about 30 isolated glenohumeral for flexion is around 90 isolated glenohumeral abduction is about 80. Rotator cuff strength is intact. Does have scapular dyskinesia with for flexion.  Assessment/Plan Advanced imaging of the right shoulder does demonstrate significant glenohumeral arthritis with intact rotator cuff. This affects both humeral side and the glenoid side without excessive retroversion or biconcave glenoid plan is for total shoulder replacement risk and benefits discussed  with the patient including but limited limited to infection nerve vessel damage shoulder stiffness shoulder dislocation need for more surgery all questions answered  DEAN,GREGORY SCOTT 01/27/2014, 7:33 AM

## 2014-01-27 NOTE — Anesthesia Preprocedure Evaluation (Signed)
Anesthesia Evaluation  Patient identified by MRN, date of birth, ID band Patient awake    Reviewed: Allergy & Precautions, H&P , NPO status , Patient's Chart, lab work & pertinent test results  Airway Mallampati: II   Neck ROM: full    Dental   Pulmonary neg pulmonary ROS,          Cardiovascular hypertension,     Neuro/Psych Anxiety  Neuromuscular disease    GI/Hepatic GERD-  ,  Endo/Other  diabetes, Type 2  Renal/GU      Musculoskeletal  (+) Arthritis -,   Abdominal   Peds  Hematology   Anesthesia Other Findings   Reproductive/Obstetrics                             Anesthesia Physical Anesthesia Plan  ASA: II  Anesthesia Plan: General and Regional   Post-op Pain Management: MAC Combined w/ Regional for Post-op pain   Induction: Intravenous  Airway Management Planned: Oral ETT  Additional Equipment:   Intra-op Plan:   Post-operative Plan: Extubation in OR  Informed Consent: I have reviewed the patients History and Physical, chart, labs and discussed the procedure including the risks, benefits and alternatives for the proposed anesthesia with the patient or authorized representative who has indicated his/her understanding and acceptance.     Plan Discussed with: CRNA, Anesthesiologist and Surgeon  Anesthesia Plan Comments:         Anesthesia Quick Evaluation

## 2014-01-27 NOTE — Anesthesia Procedure Notes (Addendum)
Anesthesia Regional Block:  Interscalene brachial plexus block  Pre-Anesthetic Checklist: ,, timeout performed, Correct Patient, Correct Site, Correct Laterality, Correct Procedure, Correct Position, site marked, Risks and benefits discussed,  Surgical consent,  Pre-op evaluation,  At surgeon's request and post-op pain management  Laterality: Right  Prep: chloraprep       Needles:  Injection technique: Single-shot  Needle Type: Echogenic Stimulator Needle     Needle Length: 5cm 5 cm Needle Gauge: 22 and 22 G    Additional Needles:  Procedures: ultrasound guided (picture in chart) and nerve stimulator Interscalene brachial plexus block  Nerve Stimulator or Paresthesia:  Response: biceps flexion, 0.45 mA,   Additional Responses:   Narrative:  Start time: 01/27/2014 11:06 AM End time: 01/27/2014 11:25 AM Injection made incrementally with aspirations every 5 mL.  Performed by: Personally   Additional Notes: Functioning IV was confirmed and monitors were applied.  A 50mm 22ga Arrow echogenic stimulator needle was used. Sterile prep and drape,hand hygiene and sterile gloves were used.  Negative aspiration and negative test dose prior to incremental administration of local anesthetic. The patient tolerated the procedure well.  Ultrasound guidance: relevent anatomy identified, needle position confirmed, local anesthetic spread visualized around nerve(s), vascular puncture avoided.  Image printed for medical record.    Procedure Name: Intubation Date/Time: 01/27/2014 11:51 AM Performed by: Fransisca KaufmannMEYER, Wreatha Sturgeon E Pre-anesthesia Checklist: Patient identified, Emergency Drugs available, Suction available, Patient being monitored and Timeout performed Patient Re-evaluated:Patient Re-evaluated prior to inductionOxygen Delivery Method: Circle system utilized Preoxygenation: Pre-oxygenation with 100% oxygen Intubation Type: IV induction Ventilation: Mask ventilation without  difficulty Laryngoscope Size: Mac and 3 Grade View: Grade I Tube type: Oral Tube size: 8.0 mm Number of attempts: 1 Airway Equipment and Method: Stylet Placement Confirmation: ETT inserted through vocal cords under direct vision and positive ETCO2 Secured at: 23 cm Tube secured with: Tape Dental Injury: Teeth and Oropharynx as per pre-operative assessment  Comments: Intubated by Lupita DawnMisty F. RN/[facility]

## 2014-01-28 LAB — GLUCOSE, CAPILLARY: Glucose-Capillary: 73 mg/dL (ref 70–99)

## 2014-01-28 MED ORDER — PROPOFOL 10 MG/ML IV BOLUS
INTRAVENOUS | Status: AC
  Filled 2014-01-28: qty 20

## 2014-01-28 MED ORDER — MIDAZOLAM HCL 2 MG/2ML IJ SOLN
INTRAMUSCULAR | Status: AC
  Filled 2014-01-28: qty 2

## 2014-01-28 MED ORDER — FENTANYL CITRATE 0.05 MG/ML IJ SOLN
INTRAMUSCULAR | Status: AC
  Filled 2014-01-28: qty 5

## 2014-01-28 NOTE — Progress Notes (Signed)
Pt to be discharged at this time with his wife and son. Sling to right shoulder in place. Administered pain med prior to leaving per pt request. CPM to be delivered to his home this afternoon. IV discontinued. Discharge instructions given to pt and his wife with verbal understanding. Will follow up with MD. Pt to took all personal belonging home. Prescriptions have already been filled by his wife.

## 2014-01-28 NOTE — Progress Notes (Signed)
Patient to be discharged home today; Kipp BroodBrent with [facility] / [facility] [phone]( [phone]) called concerning CPM; per Kipp BroodBrent, the CPM will be delivered to the patient's home tonight after the patient is discharged home; Alexis GoodellB Kortez Murtagh RN,BSN,MHA [phone]

## 2014-01-28 NOTE — Plan of Care (Signed)
Problem: Phase I Progression Outcomes Goal: Clear liquids, advance diet as tolerated Outcome: Completed/Met Date Met:  01/28/14

## 2014-01-28 NOTE — Evaluation (Signed)
Occupational Therapy Evaluation Patient Details Name: Donald Hernandez MRN: [id number] DOB: Jun 12, 1940 Today's Date: 01/28/2014    History of Present Illness s/p R TSA   Clinical Impression   Pt was independent prior to admission and very active.  Provided education on sling use, positioning, ADL, pendulums and AROM of R elbow to hand.  Handouts provided.  Pt requesting OT review information with his wife to help him reinforce learning.  Will follow.    Follow Up Recommendations  No OT follow up    Equipment Recommendations  None recommended by OT    Recommendations for Other Services       Precautions / Restrictions Precautions Precautions: Shoulder Shoulder Interventions: Shoulder sling/immobilizer Precaution Booklet Issued: Yes (comment) Required Braces or Orthoses: Sling Restrictions Other Position/Activity Restrictions: instructed to avoid WB on R UE      Mobility Bed Mobility Overal bed mobility: Modified Independent             General bed mobility comments: instructed to get up to L side to avoid pushing with R UE  Transfers Overall transfer level: Independent                    Balance Overall balance assessment: Independent                                          ADL Overall ADL's : Needs assistance/impaired Eating/Feeding: Modified independent   Grooming: Wash/dry hands;Modified independent;Standing   Upper Body Bathing: Minimal assitance;Sitting (for back)   Lower Body Bathing: Modified independent;Sit to/from stand   Upper Body Dressing : Minimal assistance;Sitting   Lower Body Dressing: Min guard;Sit to/from stand   Toilet Transfer: Independent;Ambulation   Toileting- Clothing Manipulation and Hygiene: Modified independent;Sit to/from stand       Functional mobility during ADLs: Independent General ADL Comments: Instructed in positioning R UE in bed and chair, hemitechniques for bathing and dressing, sling  use, pendulum exercises and AROM R elbow to hand.     Vision                     Perception     Praxis      Pertinent Vitals/Pain Pain Assessment: 0-10 Pain Score: 4  Pain Location: R shoulder Pain Descriptors / Indicators: Aching Pain Intervention(s): Monitored during session;Repositioned;Patient requesting pain meds-RN notified     Hand Dominance Right   Extremity/Trunk Assessment Upper Extremity Assessment Upper Extremity Assessment: RUE deficits/detail RUE Deficits / Details: full AROM elbow to hand, mild edema RUE: Unable to fully assess due to immobilization RUE Coordination: decreased gross motor   Lower Extremity Assessment Lower Extremity Assessment: Overall WFL for tasks assessed   Cervical / Trunk Assessment Cervical / Trunk Assessment: Normal   Communication Communication Communication: No difficulties   Cognition Arousal/Alertness: Awake/alert Behavior During Therapy: WFL for tasks assessed/performed Overall Cognitive Status: Within Functional Limits for tasks assessed                     General Comments       Exercises Exercises:  (pendulums, R )     Shoulder Instructions      Home Living Family/patient expects to be discharged to:: Private residence Living Arrangements: Spouse/significant other Available Help at Discharge: Family;Available 24 hours/day  Prior Functioning/Environment Level of Independence: Independent             OT Diagnosis: Acute pain;Generalized weakness   OT Problem List: Decreased strength;Decreased range of motion;Pain;Impaired UE functional use;Increased edema   OT Treatment/Interventions: Self-care/ADL training;Therapeutic exercise;Patient/family education    OT Goals(Current goals can be found in the care plan section) Acute Rehab OT Goals Patient Stated Goal: maintain active lifestyle OT Goal Formulation: With patient Time For Goal  Achievement: 02/04/14 Potential to Achieve Goals: Good ADL Goals Pt Will Perform Upper Body Dressing: with modified independence;sitting Pt/caregiver will Perform Home Exercise Program: Independently;With written HEP provided;Right Upper extremity Additional ADL Goal #1: Pt and wife will be independent in donning and doffing sling.  OT Frequency: Min 2X/week   Barriers to D/C:            Co-evaluation              End of Session Nurse Communication: Patient requests pain meds (wants bible, phone and wow card)  Activity Tolerance: Patient tolerated treatment well Patient left: in chair;with call bell/phone within reach;with nursing/sitter in room   Time: 0915-1006 OT Time Calculation (min): 51 min Charges:  OT General Charges $OT Visit: 1 Procedure OT Evaluation $Initial OT Evaluation Tier I: 1 Procedure OT Treatments $Self Care/Home Management : 8-22 mins $Therapeutic Exercise: 8-22 mins G-Codes:    [name], Keilon Ressel Lynn 01/28/2014, 10:17 AM [phone]

## 2014-01-28 NOTE — Progress Notes (Signed)
Occupational Therapy Treatment Patient Details Name: Donald Hernandez MRN: [id number] DOB: Jul 04, 1940 Today's Date: 01/28/2014    History of present illness s/p R TSA   OT comments  Second visit today to instruct wife in ADL, positioning, sling use, and pendulums so she may reinforce at home with pt. Pt and wife are comfortable with discharge home today.  Follow Up Recommendations  No OT follow up    Equipment Recommendations  None recommended by OT    Recommendations for Other Services      Precautions / Restrictions Precautions Precautions: Shoulder Shoulder Interventions: Shoulder sling/immobilizer Precaution Comments: reviewed shoulder protocol handout with wife Required Braces or Orthoses: Sling       Mobility Bed Mobility                  Transfers                      Balance                                   ADL Overall ADL's : Needs assistance/impaired                                       General ADL Comments: Instructed wife in information given pt this morning including donning and doffing shirt, sling donning and doffing, positioning R UE in bed and chair, and pendulum exercises.      Vision                     Perception     Praxis      Cognition   Behavior During Therapy: WFL for tasks assessed/performed Overall Cognitive Status: Within Functional Limits for tasks assessed                       Extremity/Trunk Assessment               Exercises  Pendulum exercises R shoulder, AROM x 10 R elbow, forearm, wrist and hand.   Shoulder Instructions       General Comments      Pertinent Vitals/ Pain       Pain Assessment: 0-10 Pain Score: 2  Pain Location: R shoulder Pain Descriptors / Indicators: Operative site guarding Pain Intervention(s): Premedicated before session  Home Living                                          Prior  Functioning/Environment              Frequency       Progress Toward Goals  OT Goals(current goals can now be found in the care plan section)  Progress towards OT goals: Progressing toward goals     Plan Discharge plan remains appropriate    Co-evaluation                 End of Session     Activity Tolerance Patient tolerated treatment well   Patient Left in chair;with call bell/phone within reach;with nursing/sitter in room;with family/visitor present   Nurse Communication          Time: 1329-1350 OT Time Calculation (min): 21 min  Charges: OT General Charges $OT Visit: 1 Procedure OT Treatments $Therapeutic Exercise: 8-22 mins  Donald Hernandez, [name] 01/28/2014, 2:38 PM  [phone]

## 2014-01-28 NOTE — Progress Notes (Signed)
Subjective: Pt stable - block still in effect   Objective: Vital signs in last 24 hours: Temp:  [97.3 F (36.3 C)-98.7 F (37.1 C)] 98.4 F (36.9 C) (12/09 0605) Pulse Rate:  [57-100] 100 (12/09 0605) Resp:  [12-20] 18 (12/09 0605) BP: (132-216)/(57-128) 132/65 mmHg (12/09 0605) SpO2:  [93 %-100 %] 97 % (12/09 0605) Weight:  [89.812 kg (198 lb)] 89.812 kg (198 lb) (12/08 0901)  Intake/Output from previous day: 12/08 0701 - 12/09 0700 In: 2500 [I.V.:2000; IV Piggyback:500] Out: 2375 [Urine:2200; Blood:175] Intake/Output this shift:    Exam:  No cellulitis present Compartment soft  Labs: No results for input(s): HGB in the last 72 hours. No results for input(s): WBC, RBC, HCT, PLT in the last 72 hours. No results for input(s): NA, K, CL, CO2, BUN, CREATININE, GLUCOSE, CALCIUM in the last 72 hours. No results for input(s): LABPT, INR in the last 72 hours.  Assessment/Plan: Possible dc today - needs pendulums  And will see how pain is after block wears off   DEAN,GREGORY SCOTT 01/28/2014, 7:36 AM

## 2014-01-28 NOTE — Progress Notes (Signed)
Chaplain visited with pt after recommendation from nurse tech. Chaplain listened to pt story of excellent care from nurse, Bufford LopeSteve Morton, last night. Chaplain offered prayer. Pt is now preparing to discharge.   01/28/14 1300  Clinical Encounter Type  Visited With Patient and family together  Visit Type Initial;Spiritual support  Referral From Other (Comment) (Nurse Tech)  Spiritual Encounters  Spiritual Needs Prayer;Emotional;Sacred text  Charmian MuffStamey, Alexandro Line F, Chaplain 01/28/2014 1:58 PM

## 2014-01-28 NOTE — Progress Notes (Signed)
Chaplain delivered a full version of the Bible to the Licensed conveyancerunit secretary.  Chaplain unable to visit due to other emergency.  Chaplain to follow up later to ensure pt received Bible.   01/28/14 1000  Clinical Encounter Type  Visited With Health care provider  Referral From Nurse  Spiritual Encounters  Spiritual Needs (Pt requests a full Bible.)  Erroll Lunavercash, Markavious Micco A, Chaplain

## 2014-01-28 NOTE — Progress Notes (Addendum)
Pt doing well - block still not worn off yet - from this am grip tri bic right arm ok Plan dc today Ok with PT Begin cpm tonight for 1 hour then 1 hour 3 times a day Thursday Pt has rx already

## 2014-01-28 NOTE — Progress Notes (Signed)
Pt ambulated around unit with sling in place standby assist with no assistive device.  Gait steady. Pt stated, " I want to walk." Will continue to monitor.

## 2014-01-28 NOTE — Plan of Care (Signed)
Problem: Phase I Progression Outcomes Goal: Pain controlled with appropriate interventions Outcome: Completed/Met Date Met:  01/28/14     

## 2014-01-29 NOTE — Op Note (Signed)
NAME:  Donald Hernandez, Donald Hernandez             ACCOUNT NO.:  [id number]  MEDICAL RECORD NO.:  [id number]  LOCATION:                                 FACILITY:  PHYSICIAN:  Burnard BuntingG. Scott Dean, M.D.    DATE OF BIRTH:  December 09, 1940  DATE OF PROCEDURE:  01/27/2014 DATE OF DISCHARGE:  01/27/2014                              OPERATIVE REPORT   PREOPERATIVE DIAGNOSIS:  Right shoulder arthritis.  POSTOPERATIVE DIAGNOSIS:  Right shoulder arthritis.  PROCEDURE:  Right shoulder replacement.  SURGEON:  Burnard BuntingG. Scott Dean, M.D.  ASSISTANT:  Patrick Jupiterarla Bethune.  ANESTHESIA:  General plus interscalene block.  ESTIMATED BLOOD LOSS:  200 mL.  DRAINS:  None.  INDICATIONS:  Donald Hernandez is a patient with right shoulder arthritis, end- stage, presents for operative management after explanation of risks and benefits.  PROCEDURE IN DETAIL:  The patient was brought to the operating room where general anesthesia was induced.  Preoperative antibiotics were administered.  Time-out was called.  The patient was placed in __________ with the head in neutral position approximately 45 degrees elevation.  Right arm, shoulder, and hand prescrubbed with alcohol and Betadine and allowed to dry, prepped with DuraPrep solution and draped in sterile manner.  Ioban used to cover the operative field.  Time-out was called.  An incision was made just from the lateral border of the coracoid up to the anterior portion of the clavicle extending distally down to about 3 cm below the axillary crease lateral to it.  Skin and subcutaneous tissues were sharply divided.  Deltopectoral interval was identified.  Cephalic vein was mobilized medially.  Cobalt retractor was placed beneath the conjoint tendon, subdeltoid adhesions were released manually.  Brown retractor was placed.  The upper 7 mm of the __________ tendon was released.  Biceps was then identified and tenodesed to the pectoralis tendon and then it was released.  The rotator interval was then  opened with the scissors all the way to the base of the coracoid. CA ligament released approximately 3 mm to 4 mm.  Conjoint tendon also released off the coracoid about 3 mm to 4 mm for increase visualization. At this time, an oblique subscapularis tenotomy was made including the capsule beginning at the rotator interval about 5 mm to 6 mm medial to its attachment with tuberosity angle approximately 45 degrees.  It should be noted that prior to this, the circumflex vessels were ligated x2 with silk ties.  The axillary nerve was palpated at this time near the muscular border of the subscap, near the glenoid.  A Darrach retractor was placed to protect it.  The subscapularis and capsule were then released down to the 6 o'clock position on the face of the humerus. Large osteophyte was present and was removed.  At this time, 5 Mason- Allen sutures were placed in both the subscap as well as in the tissue cuff on the lesser tuberosity.  These were placed for later repair.  The anterior capsule was then excised from the 12 o'clock to 6 o'clock position with great care taken to avoid injury to the nerve.  The arm was then taken in external rotation and extension __________ approximately 1 cm along the path of  the biceps tendon.  The canal was reamed up to 12 mm.  The humeral head cut was then made in approximately 30 degrees of retroversion.  This was near the rotator cuff insertion. Cap was placed.  __________ retractor was placed to expose the glenoid along with superior Hohmann retractor and an anterior glenoid neck retractor.  At this time, the humeral head could not be subluxated inferiorly and posteriorly enough to gain access to the glenoid, therefore further humeral head cut was made and approximately 30 degrees of retroversion and capsular release was required from the 12 o'clock to the basically 10 o'clock position on the glenoid face.  This was done with a capsule under tension.  After  capsular release was performed, the humeral head was subluxated with cap in place and the glenoid was drilled.  Size medium glenoid was drilled.  This was done over a guide pin.  Reasonable bleeding bone was obtained anteriorly and posteriorly. All in all about 3 mm to 4 mm of the glenoid face was reamed in order to achieve fitting of the trial glenoid.  Trial glenoid was placed.  The humeral head trial was then placed.  This corresponded to the patient's native humeral head, which is approximately 15 mm.  The patient had good translation posteriorly about 50%, about 50% inferior translation.  The patient had good external rotation and good internal rotation with the arm at about 60 degrees of abduction.  At this time, trial components were removed.  True components including the glenoid were cemented into the position.  Short stem was utilized, size 11.  Same stability parameters were maintained top of the prosthesis just slightly above the tuberosities.  At this time, thorough irrigation was performed throughout the case, approximately 4 to 5 L.  Subscapularis was then repaired using a 5 MaxBraid sutures.  Good repair was achieved.  Rotator interval was also closed with the arm in approximately 30 degrees of external rotation.  This was done with a 0 Vicryl suture.  Axillary nerve was again palpated and then found to be intact.  It should be noted that during cementation, a sponge was placed at the inferior aspect of the glenoid in order to __________ any excess cement from pulling inferiorly with the capsular release that was done.  The skin was closed using interrupted inverted 0 Vicryl suture, 2-0 Vicryl suture, and skin staples.  The cephalic vein was maintained.  The patient tolerated the procedure well without immediate complications. Transferred to recovery room in stable condition.     Burnard BuntingG. Scott Dean, M.D.     GSD/MEDQ  D:  01/27/2014  T:  01/28/2014  Job:  [id number]

## 2014-01-30 ENCOUNTER — Encounter (HOSPITAL_COMMUNITY): Payer: Self-pay | Admitting: Orthopedic Surgery

## 2014-02-28 NOTE — Discharge Summary (Signed)
Physician Discharge Summary  Patient ID: Donald Hernandez MRN: [id number] DOB/AGE: 1940-08-14 74 y.o.  Admit date: 01/27/2014 Discharge date: 01/28/2014 Admission Diagnoses:  Active Problems:   Shoulder arthritis   Discharge Diagnoses:  Same  Surgeries: Procedure(s): RIGHT TOTAL SHOULDER ARTHROPLASTY on 01/27/2014   Consultants:    Discharged Condition: Stable  Hospital Course: Donald Hernandez is an 74 y.o. male who was admitted 01/27/2014 with a chief complaint of right shoulder pain, and found to have a diagnosis of right shoulder arthritis.  They were brought to the operating room on 01/27/2014 and underwent the above named procedures. He tolerated the surgery well and was started on physical therapy and CPM postop day #1. Discharged home in good condition. Motor sensory function to the arm is intact. Will follow-up in my clinic in 1 week for reassessment   Antibiotics given:  Anti-infectives    Start     Dose/Rate Route Frequency Ordered Stop   01/27/14 2200  ceFAZolin (ANCEF) IVPB 2 g/50 mL premix     2 g100 mL/hr over 30 Minutes Intravenous 3 times per day 01/27/14 1916 01/28/14 0634   01/27/14 0600  ceFAZolin (ANCEF) IVPB 2 g/50 mL premix     2 g100 mL/hr over 30 Minutes Intravenous On call to O.R. 01/26/14 1339 01/27/14 1551    .  Recent vital signs:  Filed Vitals:   01/28/14 0605  BP: 132/65  Pulse: 100  Temp: 98.4 F (36.9 C)  Resp: 18    Recent laboratory studies:  Results for orders placed or performed during the hospital encounter of 01/27/14  Urinalysis, Routine w reflex microscopic  Result Value Ref Range   Color, Urine YELLOW YELLOW   APPearance CLEAR CLEAR   Specific Gravity, Urine 1.019 1.005 - 1.030   pH 5.0 5.0 - 8.0   Glucose, UA NEGATIVE NEGATIVE mg/dL   Hgb urine dipstick NEGATIVE NEGATIVE   Bilirubin Urine NEGATIVE NEGATIVE   Ketones, ur NEGATIVE NEGATIVE mg/dL   Protein, ur NEGATIVE NEGATIVE mg/dL   Urobilinogen, UA 0.2 0.0 - 1.0 mg/dL    Nitrite NEGATIVE NEGATIVE   Leukocytes, UA NEGATIVE NEGATIVE  Glucose, capillary  Result Value Ref Range   Glucose-Capillary 83 70 - 99 mg/dL   Comment 1 Documented in Chart    Comment 2 Notify RN   Glucose, capillary  Result Value Ref Range   Glucose-Capillary 93 70 - 99 mg/dL   Comment 1 Documented in Chart    Comment 2 Notify RN   Glucose, capillary  Result Value Ref Range   Glucose-Capillary 73 70 - 99 mg/dL   Comment 1 Notify RN     Discharge Medications:     Medication List    TAKE these medications        aspirin EC 81 MG tablet  Take 81 mg by mouth daily.     Cholecalciferol 1000 UNITS tablet  Take 2,000 Units by mouth daily.     etodolac 300 MG capsule  Commonly known as:  LODINE  Take 300 mg by mouth 2 (two) times daily.     gabapentin 100 MG capsule  Commonly known as:  NEURONTIN  Take 100 mg by mouth at bedtime.     losartan 100 MG tablet  Commonly known as:  COZAAR  Take 100 mg by mouth daily.     metFORMIN 500 MG tablet  Commonly known as:  GLUCOPHAGE  Take 250 mg by mouth daily as needed (fot diabetes).     omeprazole 20  MG capsule  Commonly known as:  PRILOSEC  Take 20 mg by mouth daily.     simvastatin 80 MG tablet  Commonly known as:  ZOCOR  Take 40 mg by mouth at bedtime.        Diagnostic Studies: No results found.  Disposition: 01-Home or Self Care      Discharge Instructions    Call MD / Call 911    Complete by:  As directed   If you experience chest pain or shortness of breath, CALL 911 and be transported to the hospital emergency room.  If you develope a fever above 101 F, pus (white drainage) or increased drainage or redness at the wound, or calf pain, call your surgeon's office.     Constipation Prevention    Complete by:  As directed   Drink plenty of fluids.  Prune juice may be helpful.  You may use a stool softener, such as Colace (over the counter) 100 mg twice a day.  Use MiraLax (over the counter) for constipation  as needed.     Diet - low sodium heart healthy    Complete by:  As directed      Discharge instructions    Complete by:  As directed   CPM 1 hour tonight CPM 1 hour three times a day beginning tomorrow Otherwise keep arm in sling Leave dressing in place until appointment next week     Increase activity slowly as tolerated    Complete by:  As directed               Signed: Gianlucas Evenson SCOTT 02/28/2014, 10:02 AM

## 2015-12-23 ENCOUNTER — Ambulatory Visit (INDEPENDENT_AMBULATORY_CARE_PROVIDER_SITE_OTHER): Payer: Medicare Other | Admitting: Orthopedic Surgery

## 2015-12-23 ENCOUNTER — Encounter (INDEPENDENT_AMBULATORY_CARE_PROVIDER_SITE_OTHER): Payer: Self-pay | Admitting: Orthopedic Surgery

## 2015-12-23 ENCOUNTER — Ambulatory Visit (INDEPENDENT_AMBULATORY_CARE_PROVIDER_SITE_OTHER): Payer: No Typology Code available for payment source

## 2015-12-23 DIAGNOSIS — M25561 Pain in right knee: Secondary | ICD-10-CM

## 2015-12-23 NOTE — Progress Notes (Signed)
Office Visit Note   Patient: Donald Hernandez           Date of Birth: 07/19/1940           MRN: [id number] Visit Date: 12/23/2015 Requested by: No referring provider defined for this encounter. PCP: Pcp Not In System  Subjective: Chief Complaint  Patient presents with  . Right Knee - Pain, Injury    Patient is here with a new problem today, complaining of right knee pain.  [place] and he passed out and fell, landed on his knees.  The left knee is ok.  The right knee hurts when he sits and stands, etc.   It hurts to walk as well.  Says it sometimes wants to give way.  Swells, hurts all the time. Takes tylenol as needed for pain.                 Review of Systems all systems reviewed negative as they relate to the chief complaint.  No fevers or chills   Assessment & Plan: Visit Diagnoses:  1. Acute pain of right knee     Plan: No obvious fracture to the knee is present.  He does have some degenerative changes present.  I think this fall has exacerbated existing arthritis and it should improve over time.  He is asking about a knee replacement and I don't think that's really indicated at this time but it Samin Milke be indicated in the future.  I would hold off on injection if he is considering replacing the knee.  He does have degenerative changes present.  Follow-Up Instructions: Return if symptoms worsen or fail to improve.   Orders:  Orders Placed This Encounter  Procedures  . XR Knee 1-2 Views Right   No orders of the defined types were placed in this encounter.     Procedures: No procedures performed   Clinical Data: No additional findings.  Objective: Vital Signs: There were no vitals taken for this visit.  Physical Exam  Constitutional: He appears well-developed.  HENT:  Head: Normocephalic.  Eyes: EOM are normal.  Neck: Normal range of motion.  Cardiovascular: Normal rate.   Pulmonary/Chest: Effort normal.  Neurological: He is alert.    Skin: Skin is warm.  Psychiatric: He has a normal mood and affect.    Ortho Exam examination of the right knee demonstrates trace effusion good range of motion stable collateral crucial ligaments intact extensor mechanism medial lateral joint line tenderness is present pedal pulses palpable no bruising is present  Specialty Comments:  No specialty comments available.  Imaging: No results found.   PMFS History: Patient Active Problem List   Diagnosis Date Noted  . 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 Anxiety    PTSD-but not on any meds  . Arthritis   . Cataracts, bilateral    immature  . Diabetes mellitus without complication (HCC)    takes Metformin daily  . Ear infection 2 wks ago   treated with Amoxicillin;completed Amoxicillin 01/19/14  . GERD (gastroesophageal reflux disease)    takes Omeprazole daily  . History of colon polyps    benign  . Hyperlipidemia    takes SImvastatin daily  . Hypertension    takes Losartan daily  . Insomnia    doesn't take any meds  . Joint pain   . Joint swelling   . Peripheral neuropathy (HCC)    takes Gabapentin daily  No family history on file.  Past Surgical History:  Procedure Laterality Date  . COLONOSCOPY    . COLONOSCOPY WITH ESOPHAGOGASTRODUODENOSCOPY (EGD) AND ESOPHAGEAL DILATION (ED)    . HERNIA REPAIR     as a child  . I&D of abdomen     d/t splinter getting in abdomen  . left ring finger amputated    . partial prostatectomy    . REPLACEMENT TOTAL KNEE Left   . right eye surgery     d/t retinal detachment  . skull surgery     as a child d/t fractured skull  . TONSILLECTOMY    . 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 Not on file.   Social History Main Topics  . Smoking status:  Never Smoker  . Smokeless tobacco: Never Used  . Alcohol use No  . Drug use: No  . Sexual activity: Yes

## 2016-03-22 ENCOUNTER — Ambulatory Visit (INDEPENDENT_AMBULATORY_CARE_PROVIDER_SITE_OTHER): Payer: No Typology Code available for payment source | Admitting: Orthopedic Surgery

## 2016-03-23 ENCOUNTER — Ambulatory Visit (INDEPENDENT_AMBULATORY_CARE_PROVIDER_SITE_OTHER): Payer: Medicare Other | Admitting: Orthopedic Surgery

## 2016-03-23 ENCOUNTER — Ambulatory Visit (INDEPENDENT_AMBULATORY_CARE_PROVIDER_SITE_OTHER): Payer: Medicare Other

## 2016-03-23 ENCOUNTER — Encounter (INDEPENDENT_AMBULATORY_CARE_PROVIDER_SITE_OTHER): Payer: Self-pay | Admitting: Orthopedic Surgery

## 2016-03-23 DIAGNOSIS — M1711 Unilateral primary osteoarthritis, right knee: Secondary | ICD-10-CM | POA: Diagnosis not present

## 2016-03-23 MED ORDER — HYALURONAN 88 MG/4ML IX SOSY
88.0000 mg | PREFILLED_SYRINGE | INTRA_ARTICULAR | Status: AC | PRN
  Administered 2016-03-23: 88 mg via INTRA_ARTICULAR

## 2016-03-23 MED ORDER — LIDOCAINE HCL 1 % IJ SOLN
5.0000 mL | INTRAMUSCULAR | Status: AC | PRN
  Administered 2016-03-23: 5 mL

## 2016-03-23 NOTE — Progress Notes (Signed)
Office Visit Note   Patient: Donald Hernandez           Date of Birth: 09-24-1940           MRN: [id number] Visit Date: 03/23/2016 Requested by: No referring provider defined for this encounter. PCP: Pcp Not In System  Subjective: Chief Complaint  Patient presents with  . Right Knee - Pain    HPI [name] a 76 year old patient with right knee pain.  He is requesting an injection today.  Has known history of arthritis.  Had left total knee replacement did well with that.  He had cortisone before that helped some but the gel injection helps more.  He describes a lot of popping and giving way.  He's had a lot of family requirements which included demand on his time recently.  Patient states to the right knee is locking and giving way.  States that his shoulders are doing reasonably well and that is left is otherwise doing well.              Review of Systems All systems reviewed are negative as they relate to the chief complaint within the history of present illness.  Patient denies  fevers or chills.    Assessment & Plan: Visit Diagnoses:  1. Primary osteoarthritis of right knee     Plan: Impression is right knee pain with primarily lateral compartment and medial compartment arthritis plan minus is injected today.  Continue with nonweightbearing quad strength and exercises.  Follow-up with me as needed.  He may need knee replacement in the future based on the severity of the arthritis in the severity of his clinical symptoms.  He will arrange that as he feels like it is necessary.  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   Orders:  Orders Placed This Encounter  Procedures  . XR KNEE 3 VIEW LEFT   No orders of the defined types were placed in this encounter.     Procedures: Large Joint Inj Date/Time: 03/23/2016 3:13 PM Performed by: Cammy Copa Authorized by: Cammy Copa    Consent Given by:  Patient Site marked: the procedure site was marked   Timeout: prior to procedure the correct patient, procedure, and site was verified   Indications:  Pain, joint swelling and diagnostic evaluation Location:  Knee Site:  R knee Prep: patient was prepped and draped in usual sterile fashion   Needle Size:  18 G Needle Length:  1.5 inches Approach:  Superolateral Ultrasound Guidance: No   Fluoroscopic Guidance: No   Arthrogram: No   Medications:  5 mL lidocaine 1 %; 88 mg Hyaluronan 88 MG/4ML Aspiration Attempted: No   Patient tolerance:  Patient tolerated the procedure well with no immediate complications     Clinical Data: No additional findings.  Objective: Vital Signs: There were no vitals taken for this visit.  Physical Exam   Constitutional: Patient appears well-developed HEENT:  Head: Normocephalic Eyes:EOM are normal Neck: Normal range of motion Cardiovascular: Normal rate Pulmonary/chest: Effort normal Neurologic: Patient is alert Skin: Skin is warm Psychiatric: Patient has normal mood and affect    Ortho Exam orthopedic exam demonstrates right knee no effusion but good range of motion lateral bending left medial joint line tenderness, sling which are stable no groin pain interlocks rotation of the leg pedal pulses palpable alignment is pretty normal in the coronal plane  Specialty Comments:  No specialty comments available.  Imaging: Xr Knee 3 View Left  Result Date: 03/23/2016 3 views left knee reviewed.  Total knee prosthesis in position.  No evidence of loosening at the bone cement interface.  Alignment is normal.  No patella fracture.  Inferior osteophyte off the patella is noted.    PMFS History: Patient Active Problem List   Diagnosis Date Noted  . 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 Anxiety    PTSD-but not on any meds  . Arthritis   . Cataracts, bilateral    immature  . Diabetes  mellitus without complication (HCC)    takes Metformin daily  . Ear infection 2 wks ago   treated with Amoxicillin;completed Amoxicillin 01/19/14  . GERD (gastroesophageal reflux disease)    takes Omeprazole daily  . History of colon polyps    benign  . Hyperlipidemia    takes SImvastatin daily  . Hypertension    takes Losartan daily  . Insomnia    doesn't take any meds  . Joint pain   . Joint swelling   . Peripheral neuropathy (HCC)    takes Gabapentin daily    No family history on file.  Past Surgical History:  Procedure Laterality Date  . COLONOSCOPY    . COLONOSCOPY WITH ESOPHAGOGASTRODUODENOSCOPY (EGD) AND ESOPHAGEAL DILATION (ED)    . HERNIA REPAIR     as a child  . I&D of abdomen     d/t splinter getting in abdomen  . left ring finger amputated    . partial prostatectomy    . REPLACEMENT TOTAL KNEE Left   . right eye surgery     d/t retinal detachment  . skull surgery     as a child d/t fractured skull  . TONSILLECTOMY    . 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 Not on file.   Social History Main Topics  . Smoking status: Never Smoker  . Smokeless tobacco: Never Used  . Alcohol use No  . Drug use: No  . Sexual activity: Yes

## 2016-03-24 ENCOUNTER — Other Ambulatory Visit (INDEPENDENT_AMBULATORY_CARE_PROVIDER_SITE_OTHER): Payer: Self-pay | Admitting: Radiology

## 2016-03-24 ENCOUNTER — Telehealth (INDEPENDENT_AMBULATORY_CARE_PROVIDER_SITE_OTHER): Payer: Self-pay | Admitting: Radiology

## 2016-03-24 NOTE — Telephone Encounter (Signed)
Call patient and schedule an appointment before April 18,2018 when his authorization expires-

## 2016-03-27 NOTE — Telephone Encounter (Signed)
Appointment made  04-07-16 (friday) @ 10:30 w/Dean for  Follow up Knee

## 2016-03-27 NOTE — Telephone Encounter (Signed)
I am so sorry, I did not tell you that Dr August Saucerean wants to see him close to the date the referral expires.  Will you call him and make appt closer to but not on 06/07/16?  So sorry.Marland Kitchen..Marland Kitchen

## 2016-03-27 NOTE — Telephone Encounter (Signed)
Deb- can you call him and schedule an appt prior to 06/07/16?  That  Is when [place]. Thanks!!

## 2016-04-07 ENCOUNTER — Ambulatory Visit (INDEPENDENT_AMBULATORY_CARE_PROVIDER_SITE_OTHER): Payer: No Typology Code available for payment source | Admitting: Orthopedic Surgery

## 2016-05-25 ENCOUNTER — Encounter (INDEPENDENT_AMBULATORY_CARE_PROVIDER_SITE_OTHER): Payer: Self-pay | Admitting: Orthopedic Surgery

## 2016-05-25 ENCOUNTER — Ambulatory Visit (INDEPENDENT_AMBULATORY_CARE_PROVIDER_SITE_OTHER): Payer: Medicare Other | Admitting: Orthopedic Surgery

## 2016-05-25 DIAGNOSIS — M25562 Pain in left knee: Secondary | ICD-10-CM | POA: Diagnosis not present

## 2016-05-25 DIAGNOSIS — M25561 Pain in right knee: Secondary | ICD-10-CM

## 2016-05-26 DIAGNOSIS — M25561 Pain in right knee: Secondary | ICD-10-CM | POA: Diagnosis not present

## 2016-05-26 MED ORDER — LIDOCAINE HCL 1 % IJ SOLN
5.0000 mL | INTRAMUSCULAR | Status: AC | PRN
Start: 2016-05-26 — End: 2016-05-26
  Administered 2016-05-26: 5 mL

## 2016-05-26 MED ORDER — METHYLPREDNISOLONE ACETATE 40 MG/ML IJ SUSP
40.0000 mg | INTRAMUSCULAR | Status: AC | PRN
  Administered 2016-05-26: 40 mg via INTRA_ARTICULAR

## 2016-05-26 MED ORDER — BUPIVACAINE HCL 0.25 % IJ SOLN
4.0000 mL | INTRAMUSCULAR | Status: AC | PRN
  Administered 2016-05-26: 4 mL via INTRA_ARTICULAR

## 2016-05-26 NOTE — Progress Notes (Signed)
Office Visit Note   Patient: Donald Hernandez           Date of Birth: [birth year]           MRN: [id number] Visit Date: 05/25/2016 Requested by: No referring provider defined for this encounter. PCP: Pcp Not In System  Subjective: Chief Complaint  Patient presents with  . Right Knee - Pain  . Left Knee - Pain    HPI: Meet is a 76 year old patient with bilateral knee pain right worse than left.  Had right knee injection to 118 which was a gel injection.  That didn't help too much.  He is in a lot of pain with that right knee.  This had good result with his left total knee replacement.  He wants to discuss having right total knee replacement.  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  He does report some weakness and giving way from that right knee.  He has is [employer] reunion coming up early in the summer.              ROS: All systems reviewed are negative as they relate to the chief complaint within the history of present illness.  Patient denies  fevers or chills.   Assessment & Plan: Visit Diagnoses:  1. Pain in both knees, unspecified chronicity     Plan: Impression is right knee pain arthritis end-stage with good result from total knee replacement on the left.  Plan is to inject that knee today.  We'll see if we can get him through at least 2-3 more months.  He will [facility] with the [facility] obtaining approval for total [facility] replacement here.  I'll see him back as needed  Follow-Up Instructions: Return if symptoms worsen or fail to improve.   Orders:  No orders of the defined types were placed in this encounter.  No orders of the defined types were placed in this encounter.     Procedures: Large Joint Inj Date/Time: 05/26/2016 7:25 PM Performed by: Cammy Copa Authorized by: Cammy Copa   Consent Given by:  Patient Site marked: the procedure site was marked   Timeout: prior to procedure the correct patient, procedure, and  site was verified   Indications:  Pain, joint swelling and diagnostic evaluation Location:  Knee Site:  R knee Prep: patient was prepped and draped in usual sterile fashion   Needle Size:  18 G Needle Length:  1.5 inches Approach:  Superolateral Ultrasound Guidance: No   Fluoroscopic Guidance: No   Arthrogram: No   Medications:  5 mL lidocaine 1 %; 4 mL bupivacaine 0.25 %; 40 mg methylPREDNISolone acetate 40 MG/ML Patient tolerance:  Patient tolerated the procedure well with no immediate complications     Clinical Data: No additional findings.  Objective: Vital Signs: There were no vitals taken for this visit.  Physical Exam:   Constitutional: Patient appears well-developed HEENT:  Head: Normocephalic Eyes:EOM are normal Neck: Normal range of motion Cardiovascular: Normal rate Pulmonary/chest: Effort normal Neurologic: Patient is alert Skin: Skin is warm Psychiatric: Patient has normal mood and affect    Ortho Exam: Orthopedic exam demonstrates no significant effusion in the right knee.  He does have medial and lateral joint line tenderness.  Collateral crucial ligaments are stable.  Does have about a 5 flexion traction that right knee.  Pedal pulses are palpable.  No groin pain with internal/external rotation of the leg.  No other  masses lymph adenopathy or skin changes noted in the right knee region.  Specialty Comments:  No specialty comments available.  Imaging: No results found.   PMFS History: Patient Active Problem List   Diagnosis Date Noted  . 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 Anxiety    PTSD-but not on any meds  . Arthritis   . Cataracts, bilateral    immature  . Diabetes mellitus without complication (HCC)    takes Metformin daily  . Ear infection 2 wks ago   treated with Amoxicillin;completed Amoxicillin 01/19/14  . GERD (gastroesophageal reflux disease)    takes Omeprazole daily  . History of colon  polyps    benign  . Hyperlipidemia    takes SImvastatin daily  . Hypertension    takes Losartan daily  . Insomnia    doesn't take any meds  . Joint pain   . Joint swelling   . Peripheral neuropathy (HCC)    takes Gabapentin daily    No family history on file.  Past Surgical History:  Procedure Laterality Date  . COLONOSCOPY    . COLONOSCOPY WITH ESOPHAGOGASTRODUODENOSCOPY (EGD) AND ESOPHAGEAL DILATION (ED)    . HERNIA REPAIR     as a child  . I&D of abdomen     d/t splinter getting in abdomen  . left ring finger amputated    . partial prostatectomy    . REPLACEMENT TOTAL KNEE Left   . right eye surgery     d/t retinal detachment  . skull surgery     as a child d/t fractured skull  . TONSILLECTOMY    . 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 Not on file.   Social History Main Topics  . Smoking status: Never Smoker  . Smokeless tobacco: Never Used  . Alcohol use No  . Drug use: No  . Sexual activity: Yes

## 2016-11-01 NOTE — Progress Notes (Signed)
Please place orders in EPIC as patient is being scheduled for a pre-op appointment! Thank you! 

## 2016-11-09 ENCOUNTER — Ambulatory Visit: Payer: Self-pay | Admitting: Orthopedic Surgery

## 2016-11-20 NOTE — Progress Notes (Signed)
09-15-16 Cardiac clearance on chart from Jolene Schimke, PA-C. Along with surgical clearance from Marisa Sprinkles, [place]  09-15-16 EKG on chart from [facility] hospital  02-23-15 ECHO on chart from [facility] hospital

## 2016-11-20 NOTE — Patient Instructions (Addendum)
Donald Hernandez  11/20/2016   Your procedure is scheduled on: 11-27-16   Report to [facility]  Entrance Report to Admitting at 10:10 AM   Call this number if you have problems the morning of surgery  [phone]   Remember: ONLY 1 PERSON MAY GO WITH YOU TO SHORT STAY TO GET  READY MORNING OF YOUR SURGERY.  Do not eat food or drink liquids :After Midnight.     Take these medicines the morning of surgery with A SIP OF WATER: Omeprazole (Prilosec) and Gabapentin (Neurontin)                               You may not have any metal on your body including hair pins and              piercings  Do not wear jewelry, make-up, lotions, powders or perfumes, deodorant             Men may shave face and neck.   Do not bring valuables to the hospital. [facility] IS NOT             RESPONSIBLE   FOR VALUABLES.  Contacts, dentures or bridgework may not be worn into surgery.  Leave suitcase in the car. After surgery it may be brought to your room.                  Please read over the following fact sheets you were given: _____________________________________________________________________             [facility] - Preparing for Surgery Before surgery, you can play an important role.  Because skin is not sterile, your skin needs to be as free of germs as possible.  You can reduce the number of germs on your skin by washing with CHG (chlorahexidine gluconate) soap before surgery.  CHG is an antiseptic cleaner which kills germs and bonds with the skin to continue killing germs even after washing. Please DO NOT use if you have an allergy to CHG or antibacterial soaps.  If your skin becomes reddened/irritated stop using the CHG and inform your nurse when you arrive at Short Stay. Do not shave (including legs and underarms) for at least 48 hours prior to the first CHG shower.  You may shave your face/neck. Please follow these instructions carefully:  1.  Shower with  CHG Soap the night before surgery and the  morning of Surgery.  2.  If you choose to wash your hair, wash your hair first as usual with your  normal  shampoo.  3.  After you shampoo, rinse your hair and body thoroughly to remove the  shampoo.                           4.  Use CHG as you would any other liquid soap.  You can apply chg directly  to the skin and wash                       Gently with a scrungie or clean washcloth.  5.  Apply the CHG Soap to your body ONLY FROM THE NECK DOWN.   Do not use on face/ open  Wound or open sores. Avoid contact with eyes, ears mouth and genitals (private parts).                       Wash face,  Genitals (private parts) with your normal soap.             6.  Wash thoroughly, paying special attention to the area where your surgery  will be performed.  7.  Thoroughly rinse your body with warm water from the neck down.  8.  DO NOT shower/wash with your normal soap after using and rinsing off  the CHG Soap.                9.  Pat yourself dry with a clean towel.            10.  Wear clean pajamas.            11.  Place clean sheets on your bed the night of your first shower and do not  sleep with pets. Day of Surgery : Do not apply any lotions/deodorants the morning of surgery.  Please wear clean clothes to the hospital/surgery center.  FAILURE TO FOLLOW THESE INSTRUCTIONS MAY RESULT IN THE CANCELLATION OF YOUR SURGERY PATIENT SIGNATURE_________________________________  NURSE SIGNATURE__________________________________  ________________________________________________________________________   Donald Hernandez  An incentive spirometer is a tool that can help keep your lungs clear and active. This tool measures how well you are filling your lungs with each breath. Taking long deep breaths may help reverse or decrease the chance of developing breathing (pulmonary) problems (especially infection) following:  A long period of  time when you are unable to move or be active. BEFORE THE PROCEDURE   If the spirometer includes an indicator to show your best effort, your nurse or respiratory therapist will set it to a desired goal.  If possible, sit up straight or lean slightly forward. Try not to slouch.  Hold the incentive spirometer in an upright position. INSTRUCTIONS FOR USE  1. Sit on the edge of your bed if possible, or sit up as far as you can in bed or on a chair. 2. Hold the incentive spirometer in an upright position. 3. Breathe out normally. 4. Place the mouthpiece in your mouth and seal your lips tightly around it. 5. Breathe in slowly and as deeply as possible, raising the piston or the ball toward the top of the column. 6. Hold your breath for 3-5 seconds or for as long as possible. Allow the piston or ball to fall to the bottom of the column. 7. Remove the mouthpiece from your mouth and breathe out normally. 8. Rest for a few seconds and repeat Steps 1 through 7 at least 10 times every 1-2 hours when you are awake. Take your time and take a few normal breaths between deep breaths. 9. The spirometer may include an indicator to show your best effort. Use the indicator as a goal to work toward during each repetition. 10. After each set of 10 deep breaths, practice coughing to be sure your lungs are clear. If you have an incision (the cut made at the time of surgery), support your incision when coughing by placing a pillow or rolled up towels firmly against it. Once you are able to get out of bed, walk around indoors and cough well. You may stop using the incentive spirometer when instructed by your caregiver.  RISKS AND COMPLICATIONS  Take your time so you do not get  dizzy or light-headed.  If you are in pain, you may need to take or ask for pain medication before doing incentive spirometry. It is harder to take a deep breath if you are having pain. AFTER USE  Rest and breathe slowly and easily.  It can  be helpful to keep track of a log of your progress. Your caregiver can provide you with a simple table to help with this. If you are using the spirometer at home, follow these instructions: [place] [facility] IF:   You are having difficultly using the spirometer.  You have trouble using the spirometer as often as instructed.  Your pain medication is not giving enough relief while using the spirometer.  You develop fever of 100.5 F (38.1 C) or higher. SEEK IMMEDIATE MEDICAL CARE IF:   You cough up bloody sputum that had not been present before.  You develop fever of 102 F (38.9 C) or greater.  You develop worsening pain at or near the incision site. MAKE SURE YOU:   Understand these instructions.  Will watch your condition.  Will get help right away if you are not doing well or get worse. Document Released: 06/19/2006 Document Revised: 05/01/2011 Document Reviewed: 08/20/2006 Exit[employer] Patient Information 2014 Exit[employer], [place].   ________________________________________________________________________  WHAT IS A BLOOD TRANSFUSION? Blood Transfusion Information  A transfusion is the replacement of blood or some of its parts. Blood is made up of multiple cells which provide different functions.  Red blood cells carry oxygen and are used for blood loss replacement.  White blood cells fight against infection.  Platelets control bleeding.  Plasma helps clot blood.  Other blood products are available for specialized needs, such as hemophilia or other clotting disorders. BEFORE THE TRANSFUSION  Who gives blood for transfusions?   Healthy volunteers who are fully evaluated to make sure their blood is safe. This is blood bank blood. Transfusion therapy is the safest it has ever been in the practice of medicine. Before blood is taken from a donor, a complete history is taken to make sure that person has no history of diseases nor engages in risky social behavior (examples are  intravenous drug use or sexual activity with multiple partners). The donor's travel history is screened to minimize risk of transmitting infections, such as malaria. The donated blood is tested for signs of infectious diseases, such as HIV and hepatitis. The blood is then tested to be sure it is compatible with you in order to minimize the chance of a transfusion reaction. If you or a relative donates blood, this is often done in anticipation of surgery and is not appropriate for emergency situations. It takes many days to process the donated blood. RISKS AND COMPLICATIONS Although transfusion therapy is very safe and saves many lives, the main dangers of transfusion include:   Getting an infectious disease.  Developing a transfusion reaction. This is an allergic reaction to something in the blood you were given. Every precaution is taken to prevent this. The decision to have a blood transfusion has been considered carefully by your caregiver before blood is given. Blood is not given unless the benefits outweigh the risks. AFTER THE TRANSFUSION  Right after receiving a blood transfusion, you will usually feel much better and more energetic. This is especially true if your red blood cells have gotten low (anemic). The transfusion raises the level of the red blood cells which carry oxygen, and this usually causes an energy increase.  The nurse administering the transfusion will  monitor you carefully for complications. HOME CARE INSTRUCTIONS  No special instructions are needed after a transfusion. You may find your energy is better. Speak with your caregiver about any limitations on activity for underlying diseases you may have. SEEK MEDICAL CARE IF:   Your condition is not improving after your transfusion.  You develop redness or irritation at the intravenous (IV) site. SEEK IMMEDIATE MEDICAL CARE IF:  Any of the following symptoms occur over the next 12 hours:  Shaking chills.  You have a  temperature by mouth above 102 F (38.9 C), not controlled by medicine.  Chest, back, or muscle pain.  People around you feel you are not acting correctly or are confused.  Shortness of breath or difficulty breathing.  Dizziness and fainting.  You get a rash or develop hives.  You have a decrease in urine output.  Your urine turns a dark color or changes to pink, red, or brown. Any of the following symptoms occur over the next 10 days:  You have a temperature by mouth above 102 F (38.9 C), not controlled by medicine.  Shortness of breath.  Weakness after normal activity.  The white part of the eye turns yellow (jaundice).  You have a decrease in the amount of urine or are urinating less often.  Your urine turns a dark color or changes to pink, red, or brown. Document Released: 02/04/2000 Document Revised: 05/01/2011 Document Reviewed: 09/23/2007 [facility] Patient Information 2014 [place], [place].  _______________________________________________________________________

## 2016-11-21 ENCOUNTER — Encounter (HOSPITAL_COMMUNITY): Payer: Self-pay

## 2016-11-21 ENCOUNTER — Encounter (HOSPITAL_COMMUNITY)
Admission: RE | Admit: 2016-11-21 | Discharge: 2016-11-21 | Disposition: A | Discharge status: Home / Self Care | Payer: Non-veteran care | Source: Ambulatory Visit | Attending: Orthopedic Surgery | Admitting: Orthopedic Surgery

## 2016-11-21 ENCOUNTER — Encounter (INDEPENDENT_AMBULATORY_CARE_PROVIDER_SITE_OTHER): Payer: Self-pay

## 2016-11-21 DIAGNOSIS — M1711 Unilateral primary osteoarthritis, right knee: Secondary | ICD-10-CM | POA: Insufficient documentation

## 2016-11-21 DIAGNOSIS — Z01812 Encounter for preprocedural laboratory examination: Secondary | ICD-10-CM | POA: Insufficient documentation

## 2016-11-21 LAB — COMPREHENSIVE METABOLIC PANEL
ALT: 15 U/L — AB (ref 17–63)
AST: 21 U/L (ref 15–41)
Albumin: 3.9 g/dL (ref 3.5–5.0)
Alkaline Phosphatase: 101 U/L (ref 38–126)
Anion gap: 7 (ref 5–15)
BUN: 27 mg/dL — ABNORMAL HIGH (ref 6–20)
CHLORIDE: 108 mmol/L (ref 101–111)
CO2: 26 mmol/L (ref 22–32)
CREATININE: 1.24 mg/dL (ref 0.61–1.24)
Calcium: 9.5 mg/dL (ref 8.9–10.3)
GFR calc non Af Amer: 55 mL/min — ABNORMAL LOW (ref >60)
Glucose, Bld: 97 mg/dL (ref 65–99)
Potassium: 4.3 mmol/L (ref 3.5–5.1)
SODIUM: 141 mmol/L (ref 135–145)
Total Bilirubin: 0.7 mg/dL (ref 0.3–1.2)
Total Protein: 7 g/dL (ref 6.5–8.1)

## 2016-11-21 LAB — SURGICAL PCR SCREEN
MRSA, PCR: NEGATIVE
Staphylococcus aureus: NEGATIVE

## 2016-11-21 LAB — HEMOGLOBIN A1C
Hgb A1c MFr Bld: 5.8 % — ABNORMAL HIGH (ref 4.8–5.6)
MEAN PLASMA GLUCOSE: 119.76 mg/dL

## 2016-11-21 LAB — CBC
HCT: 33.9 % — ABNORMAL LOW (ref 39.0–52.0)
Hemoglobin: 10.8 g/dL — ABNORMAL LOW (ref 13.0–17.0)
MCH: 27.4 pg (ref 26.0–34.0)
MCHC: 31.9 g/dL (ref 30.0–36.0)
MCV: 86 fL (ref 78.0–100.0)
PLATELETS: 218 10*3/uL (ref 150–400)
RBC: 3.94 MIL/uL — AB (ref 4.22–5.81)
RDW: 14 % (ref 11.5–15.5)
WBC: 5.2 10*3/uL (ref 4.0–10.5)

## 2016-11-21 LAB — APTT: APTT: 24 s (ref 24–36)

## 2016-11-21 LAB — PROTIME-INR
INR: 0.93
PROTHROMBIN TIME: 12.4 s (ref 11.4–15.2)

## 2016-11-21 NOTE — Progress Notes (Signed)
11-21-16 CMP result, routed to Dr. Lequita Halt for review.

## 2016-11-26 ENCOUNTER — Ambulatory Visit: Payer: Self-pay | Admitting: Orthopedic Surgery

## 2016-11-26 NOTE — H&P (Signed)
Donald Hernandez DOB: 09/13/1940 Married / Language: English / Race: Black or African American Male  Date of Admission:  10/8/218 CC:  Right knee pain History of Present Illness The patient is a 76 year old male who comes in for a preoperative History and Physical. The patient is scheduled for a right total knee arthroplasty to be performed by Dr. Frank V. Aluisio, MD at  Hospital on 11/27/2016. The patient is a 76 year old male who presented with knee complaints. The patient reports right knee symptoms including: pain which began 1 year(s) ago following a specific injury. The injury occurred due to a fall (He reports that he passed out, and fell landing on both knees).The patient feels that the symptoms are worsening. The patient has the current diagnosis of knee osteoarthritis. Prior to being seen, the patient was previously evaluated by a colleague (Dr. Dean, [facility] ago. Previous work-up for this problem has included knee x-rays. Past treatment for this problem has included intra-articular injection of corticosteroids by Dr. Dean. He states that had two cortisone injections, and then had a series of visco in March. He did not get any relief from the injections). Current treatment includes nonsteroidal anti-inflammatory drugs (Etodolac) and non-opioid analgesics (Tylenol). Risk factors include total knee replacement (on the left 10/03/06. He does note that he has some lateral burning sensations in the left knee, but overall it is doing well). Review of radiographs AP and lateral both knees show the prosthesis on the LEFT in excellent position with no periprosthetic abnormalities. On the RIGHT he has bone-on-bone arthritis in the medial and patellofemoral compartments. Patient has significant pain and dysfunction in their knee. They have had injections and failed nonoperative management. At this point the pain and dysfunction have essentially taken over their  life. The most predictable means of improving pain and function is total knee arthroplasty. We discussed the procedure risks and potential complications and rehab course in detail and the patient would like to go ahead and proceed with total knee arthroplasty. They have been treated conservatively in the past for the above stated problem and despite conservative measures, they continue to have progressive pain and severe functional limitations and dysfunction. They have failed non-operative management including home exercise, medications, and injections. It is felt that they would benefit from undergoing total joint replacement. Risks and benefits of the procedure have been discussed with the patient and they elect to proceed with surgery. There are no active contraindications to surgery such as ongoing infection or rapidly progressive neurological disease.  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 Diabetes Mellitus  Father, Mother. Drug / Alcohol Addiction  Father. Heart Disease  Father.  Social History Children  5 or more Current work status  disabled Living situation  live with spouse Marital status  married No history of drug/alcohol rehab  Number of flights of stairs before winded  1 Tobacco / smoke exposure  09/08/2016: yes outdoors only Tobacco use  Never smoker. 09/08/2016 Under pain contract   Medication History  Etodolac (300MG Capsule, Oral) Active. Simvastatin (80MG Tablet, Oral) Active. Acetaminophen (500MG Tablet, Oral) Active. Cholecalciferol (2000UNIT Tablet, Oral) Active. Losartan Potassium (100MG Tablet, Oral)    Active. Omeprazole (20MG Capsule DR, Oral) Active. Benzonatate (100MG Capsule, Oral) Active. Apixaban (5MG Tablet, Oral) Active. Aspirin (81MG Tablet, Oral) Active. Gabapentin (100MG Capsule, Oral) Active.  Past Surgical History Arthroscopy of Knee  left Arthroscopy of Shoulder  right Prostatectomy; Transurethral    Review of Systems General Not Present- Chills, Fatigue, Fever, Memory Loss, Night Sweats, Weight Gain and Weight Loss. Skin Not Present- Eczema, Hives, Itching, Lesions and Rash. HEENT Not Present- Dentures, Double Vision, Headache, Hearing Loss, Tinnitus and Visual Loss. Respiratory Not Present- Allergies, Chronic Cough, Coughing up blood, Shortness of breath at rest and Shortness of breath with exertion. Cardiovascular Not Present- Chest Pain, Difficulty Breathing Lying Down, Murmur, Palpitations, Racing/skipping heartbeats and Swelling. Gastrointestinal Not Present- Abdominal Pain, Bloody Stool, Constipation, Diarrhea, Difficulty Swallowing, Heartburn, Jaundice, Loss of appetitie, Nausea and Vomiting. Male Genitourinary Not Present- Blood in Urine, Discharge, Flank Pain, Incontinence, Painful Urination, Urgency, Urinary frequency, Urinary Retention, Urinating at Night and Weak urinary stream. Musculoskeletal Present- Joint Pain. Not Present- Back Pain, Joint Swelling, Morning Stiffness, Muscle Pain, Muscle Weakness and Spasms. Neurological Not Present- Blackout spells, Difficulty with balance, Dizziness, Paralysis, Tremor and Weakness. Psychiatric Not Present- Insomnia.  Vitals Weight: 202 lb Height: 70in Weight was reported by patient. Height was reported by patient. Body Surface Area: 2.1 m Body Mass Index: 28.98 kg/m  Pulse: 68 (Regular)  BP: 138/66 (Sitting, Right Arm, Standard)   Physical Exam General Mental Status -Alert, cooperative and good historian. General Appearance-pleasant, Not in acute distress. Orientation-Oriented  X3. Build & Nutrition-Well nourished and Well developed.  Head and Neck Head-normocephalic, atraumatic . Neck Global Assessment - supple, no bruit auscultated on the right, no bruit auscultated on the left.  Eye Vision-Wears corrective lenses. Pupil - Bilateral-Regular and Round. Motion - Bilateral-EOMI.  ENMT Note: upper and lower dentures   Chest and Lung Exam Auscultation Breath sounds - clear at anterior chest wall and clear at posterior chest wall. Adventitious sounds - No Adventitious sounds.  Cardiovascular Auscultation Rhythm - Regular rate and rhythm. Heart Sounds - S1 WNL and S2 WNL. Murmurs & Other Heart Sounds - Auscultation of the heart reveals - No Murmurs.  Abdomen Palpation/Percussion Tenderness - Abdomen is non-tender to palpation. Rigidity (guarding) - Abdomen is soft. Auscultation Auscultation of the abdomen reveals - Bowel sounds normal.  Male Genitourinary Note: Not done, not pertinent to present illness   Musculoskeletal Note: Evaluation of the left hip shows flexion to 120 rotation in 30 out 40 and abduction 40 without discomfort. There is no tenderness over the greater trochanter. There is no pain on provocative testing of the hip.Examination of the right hip shows flexion to 120 rotation in 30 abduction 40 and external rotation of 40. There is no tenderness over the greater trochanter. There is no pain on provocative testing of the hip. On his LEFT knee shows no swelling. His range is about 0-125 with no tenderness or instability. His RIGHT knee shows no effusion. A slight varus deformity. His marked crepitus on range of motion range 5-120. He is tender medial greater than lateral with no instability noted. Pulses sensation and motor are intact both lower extremities. He has an antalgic gait pattern on the RIGHT.  Review of radiographs AP and lateral both knees show the prosthesis on the LEFT in excellent position with no periprosthetic  abnormalities. On the RIGHT he has bone-on-bone arthritis in the medial and patellofemoral compartments.   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 Woods Cards: Dr. Beard  Topical TXA  Anesthesia Issues: None  Patient was instructed on what medications to stop prior to surgery.  Signed electronically by Alexzandrew L Perkins, III PA-C  

## 2016-11-27 ENCOUNTER — Inpatient Hospital Stay (HOSPITAL_COMMUNITY)
Admission: RE | Admit: 2016-11-27 | Discharge: 2016-11-29 | DRG: 470 | Disposition: A | Discharge status: Home / Self Care | Payer: Non-veteran care | Source: Ambulatory Visit | Attending: Orthopedic Surgery | Admitting: Orthopedic Surgery

## 2016-11-27 ENCOUNTER — Encounter (HOSPITAL_COMMUNITY): Payer: Self-pay | Admitting: Orthopedic Surgery

## 2016-11-27 ENCOUNTER — Inpatient Hospital Stay (HOSPITAL_COMMUNITY): Payer: Non-veteran care | Admitting: Certified Registered Nurse Anesthetist

## 2016-11-27 ENCOUNTER — Encounter (HOSPITAL_COMMUNITY)
Admission: RE | Disposition: A | Discharge status: Home / Self Care | Payer: Self-pay | Source: Ambulatory Visit | Attending: Orthopedic Surgery

## 2016-11-27 DIAGNOSIS — E1142 Type 2 diabetes mellitus with diabetic polyneuropathy: Secondary | ICD-10-CM | POA: Diagnosis present

## 2016-11-27 DIAGNOSIS — Z96652 Presence of left artificial knee joint: Secondary | ICD-10-CM | POA: Diagnosis present

## 2016-11-27 DIAGNOSIS — I4891 Unspecified atrial fibrillation: Secondary | ICD-10-CM | POA: Diagnosis present

## 2016-11-27 DIAGNOSIS — K219 Gastro-esophageal reflux disease without esophagitis: Secondary | ICD-10-CM | POA: Diagnosis present

## 2016-11-27 DIAGNOSIS — M1711 Unilateral primary osteoarthritis, right knee: Principal | ICD-10-CM | POA: Diagnosis present

## 2016-11-27 DIAGNOSIS — Z7982 Long term (current) use of aspirin: Secondary | ICD-10-CM

## 2016-11-27 DIAGNOSIS — F4310 Post-traumatic stress disorder, unspecified: Secondary | ICD-10-CM | POA: Diagnosis present

## 2016-11-27 DIAGNOSIS — I2510 Atherosclerotic heart disease of native coronary artery without angina pectoris: Secondary | ICD-10-CM | POA: Diagnosis present

## 2016-11-27 DIAGNOSIS — I10 Essential (primary) hypertension: Secondary | ICD-10-CM | POA: Diagnosis present

## 2016-11-27 DIAGNOSIS — E785 Hyperlipidemia, unspecified: Secondary | ICD-10-CM | POA: Diagnosis present

## 2016-11-27 DIAGNOSIS — M179 Osteoarthritis of knee, unspecified: Secondary | ICD-10-CM | POA: Diagnosis present

## 2016-11-27 DIAGNOSIS — M25561 Pain in right knee: Secondary | ICD-10-CM | POA: Diagnosis present

## 2016-11-27 DIAGNOSIS — M1710 Unilateral primary osteoarthritis, unspecified knee: Secondary | ICD-10-CM | POA: Diagnosis present

## 2016-11-27 HISTORY — PX: TOTAL KNEE ARTHROPLASTY: SHX125

## 2016-11-27 LAB — TYPE AND SCREEN
ABO/RH(D): O POS
ANTIBODY SCREEN: NEGATIVE

## 2016-11-27 LAB — GLUCOSE, CAPILLARY: GLUCOSE-CAPILLARY: 80 mg/dL (ref 65–99)

## 2016-11-27 SURGERY — ARTHROPLASTY, KNEE, TOTAL
Anesthesia: Spinal | Site: Knee | Laterality: Right

## 2016-11-27 MED ORDER — METOCLOPRAMIDE HCL 5 MG/ML IJ SOLN: 5.0000 mg | Freq: Three times a day (TID) | INTRAMUSCULAR | Status: DC | PRN

## 2016-11-27 MED ORDER — METOCLOPRAMIDE HCL 5 MG PO TABS: 5.0000 mg | ORAL_TABLET | Freq: Three times a day (TID) | ORAL | Status: DC | PRN

## 2016-11-27 MED ORDER — APIXABAN 2.5 MG PO TABS
2.5000 mg | ORAL_TABLET | Freq: Two times a day (BID) | ORAL | Status: DC
  Administered 2016-11-28 – 2016-11-29 (×3): 2.5 mg via ORAL
  Filled 2016-11-27 (×3): qty 1

## 2016-11-27 MED ORDER — DOCUSATE SODIUM 100 MG PO CAPS
100.0000 mg | ORAL_CAPSULE | Freq: Two times a day (BID) | ORAL | Status: DC
  Administered 2016-11-27 – 2016-11-28 (×2): 100 mg via ORAL
  Filled 2016-11-27 (×2): qty 1

## 2016-11-27 MED ORDER — PANTOPRAZOLE SODIUM 40 MG PO TBEC: 40.0000 mg | DELAYED_RELEASE_TABLET | Freq: Every day | ORAL | Status: DC

## 2016-11-27 MED ORDER — ACETAMINOPHEN 650 MG RE SUPP: 650.0000 mg | Freq: Four times a day (QID) | RECTAL | Status: DC | PRN

## 2016-11-27 MED ORDER — ONDANSETRON HCL 4 MG/2ML IJ SOLN
INTRAMUSCULAR | Status: DC | PRN
  Administered 2016-11-27: 4 mg via INTRAVENOUS

## 2016-11-27 MED ORDER — SODIUM CHLORIDE 0.9 % IJ SOLN
INTRAMUSCULAR | Status: DC | PRN
  Administered 2016-11-27: 50 mL
  Administered 2016-11-27: 10 mL

## 2016-11-27 MED ORDER — PROPOFOL 500 MG/50ML IV EMUL
INTRAVENOUS | Status: DC | PRN
  Administered 2016-11-27: 100 ug/kg/min via INTRAVENOUS

## 2016-11-27 MED ORDER — ACETAMINOPHEN 10 MG/ML IV SOLN
1000.0000 mg | Freq: Once | INTRAVENOUS | Status: AC
  Administered 2016-11-27: 1000 mg via INTRAVENOUS

## 2016-11-27 MED ORDER — CEFAZOLIN SODIUM-DEXTROSE 2-4 GM/100ML-% IV SOLN
2.0000 g | INTRAVENOUS | Status: AC
  Administered 2016-11-27: 2 g via INTRAVENOUS

## 2016-11-27 MED ORDER — ACETAMINOPHEN 325 MG PO TABS
650.0000 mg | ORAL_TABLET | Freq: Four times a day (QID) | ORAL | Status: DC | PRN
  Administered 2016-11-28 – 2016-11-29 (×3): 650 mg via ORAL
  Filled 2016-11-27 (×3): qty 2

## 2016-11-27 MED ORDER — MIDAZOLAM HCL 2 MG/2ML IJ SOLN
INTRAMUSCULAR | Status: AC
  Administered 2016-11-27: 1 mg via INTRAVENOUS
  Filled 2016-11-27: qty 2

## 2016-11-27 MED ORDER — OXYCODONE HCL 5 MG PO TABS: 5.0000 mg | ORAL_TABLET | Freq: Once | ORAL | Status: DC | PRN

## 2016-11-27 MED ORDER — BENZONATATE 100 MG PO CAPS
100.0000 mg | ORAL_CAPSULE | Freq: Two times a day (BID) | ORAL | Status: DC
  Administered 2016-11-27 – 2016-11-29 (×4): 100 mg via ORAL
  Filled 2016-11-27 (×4): qty 1

## 2016-11-27 MED ORDER — PHENYLEPHRINE 40 MCG/ML (10ML) SYRINGE FOR IV PUSH (FOR BLOOD PRESSURE SUPPORT)
PREFILLED_SYRINGE | INTRAVENOUS | Status: AC
  Filled 2016-11-27: qty 10

## 2016-11-27 MED ORDER — METHOCARBAMOL 500 MG PO TABS
500.0000 mg | ORAL_TABLET | Freq: Four times a day (QID) | ORAL | Status: DC | PRN
  Administered 2016-11-27 – 2016-11-28 (×2): 500 mg via ORAL
  Filled 2016-11-27 (×2): qty 1

## 2016-11-27 MED ORDER — DEXAMETHASONE SODIUM PHOSPHATE 10 MG/ML IJ SOLN: 10.0000 mg | Freq: Once | INTRAMUSCULAR | Status: DC

## 2016-11-27 MED ORDER — MIDAZOLAM HCL 2 MG/2ML IJ SOLN
2.0000 mg | Freq: Once | INTRAMUSCULAR | Status: AC
  Administered 2016-11-27: 1 mg via INTRAVENOUS

## 2016-11-27 MED ORDER — SODIUM CHLORIDE 0.9 % IV SOLN
INTRAVENOUS | Status: DC
  Administered 2016-11-27: 17:00:00 100 mL/h via INTRAVENOUS
  Administered 2016-11-28: 06:00:00 via INTRAVENOUS

## 2016-11-27 MED ORDER — ONDANSETRON HCL 4 MG/2ML IJ SOLN
4.0000 mg | Freq: Four times a day (QID) | INTRAMUSCULAR | Status: DC | PRN
Start: 2016-11-27 — End: 2016-11-29

## 2016-11-27 MED ORDER — BISACODYL 10 MG RE SUPP: 10.0000 mg | Freq: Every day | RECTAL | Status: DC | PRN

## 2016-11-27 MED ORDER — LACTATED RINGERS IV SOLN
INTRAVENOUS | Status: DC
  Administered 2016-11-27: 11:00:00 via INTRAVENOUS

## 2016-11-27 MED ORDER — ONDANSETRON HCL 4 MG/2ML IJ SOLN: 4.0000 mg | Freq: Four times a day (QID) | INTRAMUSCULAR | Status: DC | PRN

## 2016-11-27 MED ORDER — NON FORMULARY: 20.0000 mg | Freq: Every day | Status: DC

## 2016-11-27 MED ORDER — TRAMADOL HCL 50 MG PO TABS
50.0000 mg | ORAL_TABLET | Freq: Four times a day (QID) | ORAL | Status: DC | PRN
  Administered 2016-11-28: 100 mg via ORAL
  Filled 2016-11-27: qty 2

## 2016-11-27 MED ORDER — DEXAMETHASONE SODIUM PHOSPHATE 10 MG/ML IJ SOLN
INTRAMUSCULAR | Status: DC | PRN
  Administered 2016-11-27: 10 mg via INTRAVENOUS

## 2016-11-27 MED ORDER — MENTHOL 3 MG MT LOZG: 1.0000 | LOZENGE | OROMUCOSAL | Status: DC | PRN

## 2016-11-27 MED ORDER — TRANEXAMIC ACID 1000 MG/10ML IV SOLN
INTRAVENOUS | Status: AC | PRN
  Administered 2016-11-27: 2000 mg via TOPICAL

## 2016-11-27 MED ORDER — PHENYLEPHRINE HCL 10 MG/ML IJ SOLN
INTRAMUSCULAR | Status: DC | PRN
  Administered 2016-11-27: 40 ug via INTRAVENOUS

## 2016-11-27 MED ORDER — OXYCODONE HCL 5 MG PO TABS
5.0000 mg | ORAL_TABLET | ORAL | Status: DC | PRN
  Administered 2016-11-27: 10 mg via ORAL
  Administered 2016-11-27 (×2): 5 mg via ORAL
  Administered 2016-11-28 (×3): 10 mg via ORAL
  Filled 2016-11-27: qty 2
  Filled 2016-11-27: qty 1
  Filled 2016-11-27 (×2): qty 2
  Filled 2016-11-27: qty 1
  Filled 2016-11-27: qty 2

## 2016-11-27 MED ORDER — CEFAZOLIN SODIUM-DEXTROSE 2-4 GM/100ML-% IV SOLN
2.0000 g | Freq: Four times a day (QID) | INTRAVENOUS | Status: AC
  Administered 2016-11-27 – 2016-11-28 (×2): 2 g via INTRAVENOUS
  Filled 2016-11-27 (×2): qty 100

## 2016-11-27 MED ORDER — SODIUM CHLORIDE 0.9 % IR SOLN
Status: DC | PRN
  Administered 2016-11-27: 1000 mL

## 2016-11-27 MED ORDER — SODIUM CHLORIDE 0.9 % IJ SOLN
INTRAMUSCULAR | Status: AC
  Filled 2016-11-27: qty 50

## 2016-11-27 MED ORDER — MIDAZOLAM HCL 2 MG/2ML IJ SOLN
INTRAMUSCULAR | Status: AC
  Filled 2016-11-27: qty 2

## 2016-11-27 MED ORDER — LOSARTAN POTASSIUM 50 MG PO TABS
100.0000 mg | ORAL_TABLET | Freq: Every day | ORAL | Status: DC
  Administered 2016-11-28 – 2016-11-29 (×2): 100 mg via ORAL
  Filled 2016-11-27 (×2): qty 2

## 2016-11-27 MED ORDER — BUPIVACAINE IN DEXTROSE 0.75-8.25 % IT SOLN
INTRATHECAL | Status: DC | PRN
  Administered 2016-11-27: 1.6 mL via INTRATHECAL

## 2016-11-27 MED ORDER — HYDROMORPHONE HCL-NACL 0.5-0.9 MG/ML-% IV SOSY: 0.2500 mg | PREFILLED_SYRINGE | INTRAVENOUS | Status: DC | PRN

## 2016-11-27 MED ORDER — CEFAZOLIN SODIUM-DEXTROSE 2-4 GM/100ML-% IV SOLN
INTRAVENOUS | Status: AC
  Filled 2016-11-27: qty 100

## 2016-11-27 MED ORDER — FENTANYL CITRATE (PF) 100 MCG/2ML IJ SOLN
INTRAMUSCULAR | Status: AC
  Administered 2016-11-27: 50 ug via INTRAVENOUS
  Filled 2016-11-27: qty 2

## 2016-11-27 MED ORDER — ONDANSETRON HCL 4 MG PO TABS: 4.0000 mg | ORAL_TABLET | Freq: Four times a day (QID) | ORAL | Status: DC | PRN

## 2016-11-27 MED ORDER — TRANEXAMIC ACID 1000 MG/10ML IV SOLN
2000.0000 mg | Freq: Once | INTRAVENOUS | Status: DC
  Filled 2016-11-27: qty 20

## 2016-11-27 MED ORDER — OMEPRAZOLE 20 MG PO CPDR
20.0000 mg | DELAYED_RELEASE_CAPSULE | Freq: Every day | ORAL | Status: DC
  Administered 2016-11-27 – 2016-11-29 (×3): 20 mg via ORAL
  Filled 2016-11-27 (×3): qty 1

## 2016-11-27 MED ORDER — OXYCODONE HCL 5 MG/5ML PO SOLN: 5.0000 mg | Freq: Once | ORAL | Status: DC | PRN

## 2016-11-27 MED ORDER — BUPIVACAINE LIPOSOME 1.3 % IJ SUSP
INTRAMUSCULAR | Status: DC | PRN
  Administered 2016-11-27: 20 mL

## 2016-11-27 MED ORDER — GABAPENTIN 100 MG PO CAPS
100.0000 mg | ORAL_CAPSULE | Freq: Two times a day (BID) | ORAL | Status: DC
  Administered 2016-11-27 – 2016-11-29 (×4): 100 mg via ORAL
  Filled 2016-11-27 (×4): qty 1

## 2016-11-27 MED ORDER — FENTANYL CITRATE (PF) 100 MCG/2ML IJ SOLN
100.0000 ug | Freq: Once | INTRAMUSCULAR | Status: AC
  Administered 2016-11-27: 50 ug via INTRAVENOUS

## 2016-11-27 MED ORDER — POLYETHYLENE GLYCOL 3350 17 G PO PACK: 17.0000 g | PACK | Freq: Every day | ORAL | Status: DC | PRN

## 2016-11-27 MED ORDER — MORPHINE SULFATE (PF) 4 MG/ML IV SOLN: 1.0000 mg | INTRAVENOUS | Status: DC | PRN

## 2016-11-27 MED ORDER — CHLORHEXIDINE GLUCONATE 4 % EX LIQD: 60.0000 mL | Freq: Once | CUTANEOUS | Status: DC

## 2016-11-27 MED ORDER — FENTANYL CITRATE (PF) 100 MCG/2ML IJ SOLN
INTRAMUSCULAR | Status: AC
Start: 2016-11-27 — End: 2016-11-27
  Filled 2016-11-27: qty 2

## 2016-11-27 MED ORDER — DIPHENHYDRAMINE HCL 12.5 MG/5ML PO ELIX: 12.5000 mg | ORAL_SOLUTION | ORAL | Status: DC | PRN

## 2016-11-27 MED ORDER — PHENOL 1.4 % MT LIQD
1.0000 | OROMUCOSAL | Status: DC | PRN
  Filled 2016-11-27: qty 177

## 2016-11-27 MED ORDER — FLEET ENEMA 7-19 GM/118ML RE ENEM: 1.0000 | ENEMA | Freq: Once | RECTAL | Status: DC | PRN

## 2016-11-27 MED ORDER — ONDANSETRON HCL 4 MG/2ML IJ SOLN
INTRAMUSCULAR | Status: AC
  Filled 2016-11-27: qty 2

## 2016-11-27 MED ORDER — DEXAMETHASONE SODIUM PHOSPHATE 10 MG/ML IJ SOLN
10.0000 mg | Freq: Once | INTRAMUSCULAR | Status: AC
  Administered 2016-11-28: 08:00:00 10 mg via INTRAVENOUS
  Filled 2016-11-27: qty 1

## 2016-11-27 MED ORDER — ACETAMINOPHEN 10 MG/ML IV SOLN
INTRAVENOUS | Status: AC
  Filled 2016-11-27: qty 100

## 2016-11-27 MED ORDER — DEXAMETHASONE SODIUM PHOSPHATE 10 MG/ML IJ SOLN
INTRAMUSCULAR | Status: AC
  Filled 2016-11-27: qty 1

## 2016-11-27 MED ORDER — ACETAMINOPHEN 500 MG PO TABS
1000.0000 mg | ORAL_TABLET | Freq: Four times a day (QID) | ORAL | Status: AC
  Administered 2016-11-28 (×3): 1000 mg via ORAL
  Filled 2016-11-27 (×3): qty 2

## 2016-11-27 MED ORDER — ATORVASTATIN CALCIUM 40 MG PO TABS
40.0000 mg | ORAL_TABLET | Freq: Every day | ORAL | Status: DC
  Administered 2016-11-27 – 2016-11-28 (×2): 40 mg via ORAL
  Filled 2016-11-27 (×2): qty 1

## 2016-11-27 MED ORDER — MIDAZOLAM HCL 5 MG/5ML IJ SOLN
INTRAMUSCULAR | Status: DC | PRN
  Administered 2016-11-27 (×4): 1 mg via INTRAVENOUS

## 2016-11-27 MED ORDER — METHOCARBAMOL 1000 MG/10ML IJ SOLN
500.0000 mg | Freq: Four times a day (QID) | INTRAVENOUS | Status: DC | PRN
  Filled 2016-11-27: qty 5

## 2016-11-27 MED ORDER — BUPIVACAINE LIPOSOME 1.3 % IJ SUSP
20.0000 mL | Freq: Once | INTRAMUSCULAR | Status: DC
  Filled 2016-11-27 (×2): qty 20

## 2016-11-27 MED ORDER — SODIUM CHLORIDE 0.9 % IJ SOLN
INTRAMUSCULAR | Status: AC
  Filled 2016-11-27: qty 10

## 2016-11-27 MED ORDER — LACTATED RINGERS IV SOLN
INTRAVENOUS | Status: DC | PRN
  Administered 2016-11-27 (×2): via INTRAVENOUS

## 2016-11-27 MED ORDER — FENTANYL CITRATE (PF) 100 MCG/2ML IJ SOLN
INTRAMUSCULAR | Status: DC | PRN
  Administered 2016-11-27 (×2): 50 ug via INTRAVENOUS

## 2016-11-27 MED ORDER — PROPOFOL 10 MG/ML IV BOLUS
INTRAVENOUS | Status: AC
  Filled 2016-11-27: qty 40

## 2016-11-27 MED ORDER — ROPIVACAINE HCL 7.5 MG/ML IJ SOLN
INTRAMUSCULAR | Status: DC | PRN
  Administered 2016-11-27: 20 mL via PERINEURAL

## 2016-11-27 SURGICAL SUPPLY — 51 items
BAG DECANTER FOR FLEXI CONT (MISCELLANEOUS) ×3 IMPLANT
BAG SPEC THK2 15X12 ZIP CLS (MISCELLANEOUS) ×1
BAG ZIPLOCK 12X15 (MISCELLANEOUS) ×3 IMPLANT
BANDAGE ACE 6X5 VEL STRL LF (GAUZE/BANDAGES/DRESSINGS) ×3 IMPLANT
BLADE SAG 18X100X1.27 (BLADE) ×3 IMPLANT
BLADE SAW SGTL 11.0X1.19X90.0M (BLADE) ×3 IMPLANT
BOWL SMART MIX CTS (DISPOSABLE) ×3 IMPLANT
CAP KNEE TOTAL 3 SIGMA ×2 IMPLANT
CEMENT HV SMART SET (Cement) ×6 IMPLANT
CLOSURE WOUND 1/2 X4 (GAUZE/BANDAGES/DRESSINGS) ×1
COVER SURGICAL LIGHT HANDLE (MISCELLANEOUS) ×3 IMPLANT
CUFF TOURN SGL QUICK 34 (TOURNIQUET CUFF) ×3
CUFF TRNQT CYL 34X4X40X1 (TOURNIQUET CUFF) ×1 IMPLANT
DECANTER SPIKE VIAL GLASS SM (MISCELLANEOUS) ×3 IMPLANT
DRAPE U-SHAPE 47X51 STRL (DRAPES) ×3 IMPLANT
DRSG ADAPTIC 3X8 NADH LF (GAUZE/BANDAGES/DRESSINGS) ×3 IMPLANT
DRSG PAD ABDOMINAL 8X10 ST (GAUZE/BANDAGES/DRESSINGS) ×3 IMPLANT
DURAPREP 26ML APPLICATOR (WOUND CARE) ×3 IMPLANT
ELECT REM PT RETURN 15FT ADLT (MISCELLANEOUS) ×3 IMPLANT
EVACUATOR 1/8 PVC DRAIN (DRAIN) ×3 IMPLANT
GAUZE SPONGE 4X4 12PLY STRL (GAUZE/BANDAGES/DRESSINGS) ×3 IMPLANT
GLOVE BIO SURGEON STRL SZ7.5 (GLOVE) IMPLANT
GLOVE BIO SURGEON STRL SZ8 (GLOVE) ×3 IMPLANT
GLOVE BIOGEL PI IND STRL 6.5 (GLOVE) IMPLANT
GLOVE BIOGEL PI IND STRL 8 (GLOVE) ×1 IMPLANT
GLOVE BIOGEL PI INDICATOR 6.5 (GLOVE)
GLOVE BIOGEL PI INDICATOR 8 (GLOVE) ×2
GLOVE SURG SS PI 6.5 STRL IVOR (GLOVE) IMPLANT
GOWN STRL REUS W/TWL LRG LVL3 (GOWN DISPOSABLE) ×3 IMPLANT
GOWN STRL REUS W/TWL XL LVL3 (GOWN DISPOSABLE) IMPLANT
HANDPIECE INTERPULSE COAX TIP (DISPOSABLE) ×3
IMMOBILIZER KNEE 20 (SOFTGOODS) ×3
IMMOBILIZER KNEE 20 THIGH 36 (SOFTGOODS) ×1 IMPLANT
MANIFOLD NEPTUNE II (INSTRUMENTS) ×3 IMPLANT
NS IRRIG 1000ML POUR BTL (IV SOLUTION) ×3 IMPLANT
PACK TOTAL KNEE CUSTOM (KITS) ×3 IMPLANT
PAD ABD 8X10 STRL (GAUZE/BANDAGES/DRESSINGS) ×2 IMPLANT
PADDING CAST COTTON 6X4 STRL (CAST SUPPLIES) ×5 IMPLANT
POSITIONER SURGICAL ARM (MISCELLANEOUS) ×3 IMPLANT
SET HNDPC FAN SPRY TIP SCT (DISPOSABLE) ×1 IMPLANT
STRIP CLOSURE SKIN 1/2X4 (GAUZE/BANDAGES/DRESSINGS) ×3 IMPLANT
SUT MNCRL AB 4-0 PS2 18 (SUTURE) ×3 IMPLANT
SUT STRATAFIX 0 PDS 27 VIOLET (SUTURE) ×3
SUT VIC AB 2-0 CT1 27 (SUTURE) ×9
SUT VIC AB 2-0 CT1 TAPERPNT 27 (SUTURE) ×3 IMPLANT
SUTURE STRATFX 0 PDS 27 VIOLET (SUTURE) ×1 IMPLANT
SYR 30ML LL (SYRINGE) ×6 IMPLANT
TRAY FOLEY W/METER SILVER 16FR (SET/KITS/TRAYS/PACK) ×3 IMPLANT
WATER STERILE IRR 1000ML POUR (IV SOLUTION) ×6 IMPLANT
WRAP KNEE MAXI GEL POST OP (GAUZE/BANDAGES/DRESSINGS) ×3 IMPLANT
YANKAUER SUCT BULB TIP 10FT TU (MISCELLANEOUS) ×3 IMPLANT

## 2016-11-27 NOTE — Anesthesia Procedure Notes (Signed)
Spinal  Patient location during procedure: OR Start time: 11/27/2016 1:11 PM End time: 11/27/2016 1:17 PM Staffing Anesthesiologist: Chaney Malling, Azaleah Usman Performed: anesthesiologist  Preanesthetic Checklist Completed: patient identified, surgical consent, pre-op evaluation, timeout performed, IV checked, risks and benefits discussed and monitors and equipment checked Spinal Block Patient position: sitting Prep: DuraPrep Patient monitoring: cardiac monitor, continuous pulse ox and blood pressure Approach: right paramedian Location: L3-4 Injection technique: single-shot Needle Needle type: Quincke  Needle gauge: 22 G Needle length: 9 cm Assessment Sensory level: T10 Additional Notes Functioning IV was confirmed and monitors were applied. Sterile prep and drape, including hand hygiene and sterile gloves were used. The patient was positioned and the spine was prepped. The skin was anesthetized with lidocaine.  Free flow of clear CSF was obtained prior to injecting local anesthetic into the CSF.  The spinal needle aspirated freely following injection.  The needle was carefully withdrawn.  The patient tolerated the procedure well.

## 2016-11-27 NOTE — Anesthesia Preprocedure Evaluation (Addendum)
Anesthesia Evaluation  Patient identified by MRN, date of birth, ID band Patient awake    Reviewed: Allergy & Precautions, H&P , NPO status , Patient's Chart, lab work & pertinent test results  Airway Mallampati: II   Neck ROM: full    Dental   Pulmonary neg pulmonary ROS,    breath sounds clear to auscultation       Cardiovascular hypertension, + dysrhythmias Atrial Fibrillation  Rhythm:regular Rate:Normal  Paroxysmal AF. Takes eliquis   Neuro/Psych PSYCHIATRIC DISORDERS Anxiety  Neuromuscular disease    GI/Hepatic GERD  ,  Endo/Other  diabetes, Type 2  Renal/GU      Musculoskeletal  (+) Arthritis ,   Abdominal   Peds  Hematology   Anesthesia Other Findings   Reproductive/Obstetrics                            Anesthesia Physical Anesthesia Plan  ASA: III  Anesthesia Plan: Spinal   Post-op Pain Management:  Regional for Post-op pain   Induction: Intravenous  PONV Risk Score and Plan: 1 and Ondansetron, Dexamethasone, Propofol infusion and Midazolam  Airway Management Planned: Simple Face Mask  Additional Equipment:   Intra-op Plan:   Post-operative Plan:   Informed Consent: I have reviewed the patients History and Physical, chart, labs and discussed the procedure including the risks, benefits and alternatives for the proposed anesthesia with the patient or authorized representative who has indicated his/her understanding and acceptance.     Plan Discussed with: CRNA, Anesthesiologist and Surgeon  Anesthesia Plan Comments:         Anesthesia Quick Evaluation

## 2016-11-27 NOTE — Transfer of Care (Signed)
Immediate Anesthesia Transfer of Care Note  Patient: Donald Hernandez  Procedure(s) Performed: RIGHT TOTAL KNEE ARTHROPLASTY (Right Knee)  Patient Location: PACU  Anesthesia Type:Spinal  Level of Consciousness: awake, oriented, patient cooperative, lethargic and responds to stimulation  Airway & Oxygen Therapy: Patient Spontanous Breathing and Patient connected to face mask oxygen  Post-op Assessment: Report given to RN and Post -op Vital signs reviewed and stable  Post vital signs: Reviewed and stable  Last Vitals:  Vitals:   11/27/16 1225 11/27/16 1226  BP:  (!) 156/63  Pulse: 65 62  Resp: 14 19  Temp:    SpO2: 98% 97%    Last Pain:  Vitals:   11/27/16 1044  TempSrc: Oral         Complications: No apparent anesthesia complications

## 2016-11-27 NOTE — Anesthesia Procedure Notes (Signed)
Anesthesia Regional Block: Adductor canal block   Pre-Anesthetic Checklist: ,, timeout performed, Correct Patient, Correct Site, Correct Laterality, Correct Procedure, Correct Position, site marked, Risks and benefits discussed,  Surgical consent,  Pre-op evaluation,  At surgeon's request and post-op pain management  Laterality: Right  Prep: chloraprep       Needles:  Injection technique: Single-shot  Needle Type: Echogenic Needle     Needle Length: 9cm  Needle Gauge: 21     Additional Needles:   Narrative:  Start time: 11/27/2016 12:20 PM End time: 11/27/2016 12:27 PM Injection made incrementally with aspirations every 5 mL.  Performed by: Personally  Anesthesiologist: Zacchaeus Halm  Additional Notes: Pt tolerated the procedure well.

## 2016-11-27 NOTE — Anesthesia Postprocedure Evaluation (Signed)
Anesthesia Post Note  Patient: Donald Hernandez  Procedure(s) Performed: RIGHT TOTAL KNEE ARTHROPLASTY (Right Knee)     Patient location during evaluation: PACU Anesthesia Type: Spinal Level of consciousness: oriented and awake and alert Pain management: pain level controlled Vital Signs Assessment: post-procedure vital signs reviewed and stable Respiratory status: spontaneous breathing, respiratory function stable and patient connected to nasal cannula oxygen Cardiovascular status: blood pressure returned to baseline and stable Postop Assessment: no headache, no backache and no apparent nausea or vomiting Anesthetic complications: no    Last Vitals:  Vitals:   11/27/16 1600 11/27/16 1624  BP: (!) 145/68 (!) 156/64  Pulse: (!) 45 (!) 48  Resp: 12 16  Temp: 36.4 C 36.4 C  SpO2: 99% 100%    Last Pain:  Vitals:   11/27/16 1624  TempSrc: Oral  PainSc: 0-No pain                 Kaliann Coryell S

## 2016-11-27 NOTE — Progress Notes (Signed)
Assisted Dr. Hodierne with right, ultrasound guided, adductor canal block. Side rails up, monitors on throughout procedure. See vital signs in flow sheet. Tolerated Procedure well.  

## 2016-11-27 NOTE — Interval H&P Note (Signed)
History and Physical Interval Note:  11/27/2016 10:29 AM  Donald Hernandez  has presented today for surgery, with the diagnosis of Osteoarthritis Right Knee  The various methods of treatment have been discussed with the patient and family. After consideration of risks, benefits and other options for treatment, the patient has consented to  Procedure(s): RIGHT TOTAL KNEE ARTHROPLASTY (Right) as a surgical intervention .  The patient's history has been reviewed, patient examined, no change in status, stable for surgery.  I have reviewed the patient's chart and labs.  Questions were answered to the patient's satisfaction.     Loanne Drilling

## 2016-11-27 NOTE — Op Note (Signed)
OPERATIVE REPORT-TOTAL KNEE ARTHROPLASTY   Pre-operative diagnosis- Osteoarthritis  Right knee(s)  Post-operative diagnosis- Osteoarthritis Right knee(s)  Procedure-  Right  Total Knee Arthroplasty  Surgeon- Donald Rankin. Giannina Bartolome, MD  Assistant- Avel Peace, PA-C   Anesthesia-  Adductor canal block and spinal  EBL-* No blood loss amount entered *   Drains Hemovac  Tourniquet time-  Total Tourniquet Time Documented: Thigh (Right) - 35 minutes Total: Thigh (Right) - 35 minutes     Complications- None  Condition-PACU - hemodynamically stable.   Brief Clinical Note  Donald Hernandez is a 76 y.o. year old male with end stage OA of his right knee with progressively worsening pain and dysfunction. He has constant pain, with activity and at rest and significant functional deficits with difficulties even with ADLs. He has had extensive non-op management including analgesics, injections of cortisone, and home exercise program, but remains in significant pain with significant dysfunction. Radiographs show bone on bone arthritis medial and patellofemoral. He presents now for right Total Knee Arthroplasty.    Procedure in detail---   The patient is brought into the operating room and positioned supine on the operating table. After successful administration of  Adductor canal block and spinal,   a tourniquet is placed high on the  Right thigh(s) and the lower extremity is prepped and draped in the usual sterile fashion. Time out is performed by the operating team and then the  Right lower extremity is wrapped in Esmarch, knee flexed and the tourniquet inflated to 300 mmHg.       A midline incision is made with a ten blade through the subcutaneous tissue to the level of the extensor mechanism. A fresh blade is used to make a medial parapatellar arthrotomy. Soft tissue over the proximal medial tibia is subperiosteally elevated to the joint line with a knife and into the semimembranosus bursa with  a Cobb elevator. Soft tissue over the proximal lateral tibia is elevated with attention being paid to avoiding the patellar tendon on the tibial tubercle. The patella is everted, knee flexed 90 degrees and the ACL and PCL are removed. Findings are bone on bone medial and patellofemoral with large global osteophytes.        The drill is used to create a starting hole in the distal femur and the canal is thoroughly irrigated with sterile saline to remove the fatty contents. The 5 degree Right  valgus alignment guide is placed into the femoral canal and the distal femoral cutting block is pinned to remove 10 mm off the distal femur. Resection is made with an oscillating saw.      The tibia is subluxed forward and the menisci are removed. The extramedullary alignment guide is placed referencing proximally at the medial aspect of the tibial tubercle and distally along the second metatarsal axis and tibial crest. The block is pinned to remove 2mm off the more deficient medial  side. Resection is made with an oscillating saw. Size 4is the most appropriate size for the tibia and the proximal tibia is prepared with the modular drill and keel punch for that size.      The femoral sizing guide is placed and size 3 is most appropriate. Rotation is marked off the epicondylar axis and confirmed by creating a rectangular flexion gap at 90 degrees. The size 3 cutting block is pinned in this rotation and the anterior, posterior and chamfer cuts are made with the oscillating saw. The intercondylar block is then placed and that cut  is made.      Trial size 4 tibial component, trial size 3 posterior stabilized femur and a 12.5  mm posterior stabilized rotating platform insert trial is placed. Full extension is achieved with excellent varus/valgus and anterior/posterior balance throughout full range of motion. The patella is everted and thickness measured to be 27  mm. Free hand resection is taken to 15 mm, a 41 template is placed,  lug holes are drilled, trial patella is placed, and it tracks normally. Osteophytes are removed off the posterior femur with the trial in place. All trials are removed and the cut bone surfaces prepared with pulsatile lavage. Cement is mixed and once ready for implantation, the size 4 tibial implant, size  3 posterior stabilized femoral component, and the size 41 patella are cemented in place and the patella is held with the clamp. The trial insert is placed and the knee held in full extension. The Exparel (20 ml mixed with 60 ml saline) is injected into the extensor mechanism, posterior capsule, medial and lateral gutters and subcutaneous tissues.  All extruded cement is removed and once the cement is hard the permanent 12.5 mm posterior stabilized rotating platform insert is placed into the tibial tray.      The wound is copiously irrigated with saline solution and the extensor mechanism closed over a hemovac drain with #1 V-loc suture. The tourniquet is released for a total tourniquet time of 33  minutes. Flexion against gravity is 140 degrees and the patella tracks normally. Subcutaneous tissue is closed with 2.0 vicryl and subcuticular with running 4.0 Monocryl. The incision is cleaned and dried and steri-strips and a bulky sterile dressing are applied. The limb is placed into a knee immobilizer and the patient is awakened and transported to recovery in stable condition.      Please note that a surgical assistant was a medical necessity for this procedure in order to perform it in a safe and expeditious manner. Surgical assistant was necessary to retract the ligaments and vital neurovascular structures to prevent injury to them and also necessary for proper positioning of the limb to allow for anatomic placement of the prosthesis.   Donald Rankin Taishaun Levels, MD    11/27/2016, 2:15 PM

## 2016-11-27 NOTE — H&P (View-Only) (Signed)
Donald Hernandez DOB: 10-22-1940 Married / Language: English / Race: Black or African American Male  Date of Admission:  10/8/218 CC:  Right knee pain History of Present Illness The patient is a 76 year old male who comes in for a preoperative History and Physical. The patient is scheduled for a right total knee arthroplasty to be performed by Dr. Gus Rankin. Aluisio, MD at [facility] on 11/27/2016. The patient is a 76 year old male who presented with knee complaints. The patient reports right knee symptoms including: pain which began 1 year(s) ago following a specific injury. The injury occurred due to a fall (He reports that he passed out, and fell landing on both knees).The patient feels that the symptoms are worsening. The patient has the current diagnosis of knee osteoarthritis. Prior to being seen, the patient was previously evaluated by a colleague (Dr. August Saucer, and more recently at the [facility]) 2 month(s) ago. Previous work-up for this problem has included knee x-rays. Past treatment for this problem has included intra-articular injection of corticosteroids by Dr. August Saucer. He states that had two cortisone injections, and then had a series of visco in March. He did not get any relief from the injections). Current treatment includes nonsteroidal anti-inflammatory drugs (Etodolac) and non-opioid analgesics (Tylenol). Risk factors include total knee replacement (on the left 10/03/06. He does note that he has some lateral burning sensations in the left knee, but overall it is doing well). Review of radiographs AP and lateral both knees show the prosthesis on the LEFT in excellent position with no periprosthetic abnormalities. On the RIGHT he has bone-on-bone arthritis in the medial and patellofemoral compartments. Patient has significant pain and dysfunction in their knee. They have had injections and failed nonoperative management. At this point the pain and dysfunction have essentially taken over their  life. The most predictable means of improving pain and function is total knee arthroplasty. We discussed the procedure risks and potential complications and rehab course in detail and the patient would like to go ahead and proceed with total knee arthroplasty. They have been treated conservatively in the past for the above stated problem and despite conservative measures, they continue to have progressive pain and severe functional limitations and dysfunction. They have failed non-operative management including home exercise, medications, and injections. It is felt that they would benefit from undergoing total joint replacement. Risks and benefits of the procedure have been discussed with the patient and they elect to proceed with surgery. There are no active contraindications to surgery such as ongoing infection or rapidly progressive neurological disease.  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 Diabetes Mellitus  Father, Mother. Drug / Alcohol Addiction  Father. Heart Disease  Father.  Social History Children  5 or more Current work status  disabled Living situation  live with spouse Marital status  married No history of drug/alcohol rehab  Number of flights of stairs before winded  1 Tobacco / smoke exposure  09/08/2016: yes outdoors only Tobacco use  Never smoker. 09/08/2016 Under pain contract   Medication History  Etodolac (  Capsule, Oral) Active. Simvastatin (  Tablet, Oral) Active. Acetaminophen (  Tablet, Oral) Active. Cholecalciferol (2000UNIT Tablet, Oral) Active. Losartan Potassium (  Tablet, Oral)  Active. Omeprazole (  Capsule DR, Oral) Active. Benzonatate (  Capsule, Oral) Active. Apixaban (  Tablet, Oral) Active. Aspirin (  Tablet, Oral) Active. Gabapentin (  Capsule, Oral) Active.  Past Surgical History Arthroscopy of Knee  left Arthroscopy of Shoulder  right Prostatectomy; Transurethral    Review of Systems General Not Present- Chills, Fatigue, Fever, Memory Loss, Night Sweats, Weight Gain and Weight Loss. Skin Not Present- Eczema, Hives, Itching, Lesions and Rash. HEENT Not Present- Dentures, Double Vision, Headache, Hearing Loss, Tinnitus and Visual Loss. Respiratory Not Present- Allergies, Chronic Cough, Coughing up blood, Shortness of breath at rest and Shortness of breath with exertion. Cardiovascular Not Present- Chest Pain, Difficulty Breathing Lying Down, Murmur, Palpitations, Racing/skipping heartbeats and Swelling. Gastrointestinal Not Present- Abdominal Pain, Bloody Stool, Constipation, Diarrhea, Difficulty Swallowing, Heartburn, Jaundice, Loss of appetitie, Nausea and Vomiting. Male Genitourinary Not Present- Blood in Urine, Discharge, Flank Pain, Incontinence, Painful Urination, Urgency, Urinary frequency, Urinary Retention, Urinating at Night and Weak urinary stream. Musculoskeletal Present- Joint Pain. Not Present- Back Pain, Joint Swelling, Morning Stiffness, Muscle Pain, Muscle Weakness and Spasms. Neurological Not Present- Blackout spells, Difficulty with balance, Dizziness, Paralysis, Tremor and Weakness. Psychiatric Not Present- Insomnia.  Vitals Weight: 202 lb Height: 70in Weight was reported by patient. Height was reported by patient. Body Surface Area: 2.1 m Body Mass Index: 28.98 kg/m  Pulse: 68 (Regular)  BP: 138/66 (Sitting, Right Arm, Standard)   Physical Exam General Mental Status -Alert, cooperative and good historian. General Appearance-pleasant, Not in acute distress. Orientation-Oriented  X3. Build & Nutrition-Well nourished and Well developed.  Head and Neck Head-normocephalic, atraumatic . Neck Global Assessment - supple, no bruit auscultated on the right, no bruit auscultated on the left.  Eye Vision-Wears corrective lenses. Pupil - Bilateral-Regular and Round. Motion - Bilateral-EOMI.  ENMT Note: upper and lower dentures   Chest and Lung Exam Auscultation Breath sounds - clear at anterior chest wall and clear at posterior chest wall. Adventitious sounds - No Adventitious sounds.  Cardiovascular Auscultation Rhythm - Regular rate and rhythm. Heart Sounds - S1 WNL and S2 WNL. Murmurs & Other Heart Sounds - Auscultation of the heart reveals - No Murmurs.  Abdomen Palpation/Percussion Tenderness - Abdomen is non-tender to palpation. Rigidity (guarding) - Abdomen is soft. Auscultation Auscultation of the abdomen reveals - Bowel sounds normal.  Male Genitourinary Note: Not done, not pertinent to present illness   Musculoskeletal Note: Evaluation of the left hip shows flexion to 120 rotation in 30 out 40 and abduction 40 without discomfort. There is no tenderness over the greater trochanter. There is no pain on provocative testing of the hip.Examination of the right hip shows flexion to 120 rotation in 30 abduction 40 and external rotation of 40. There is no tenderness over the greater trochanter. There is no pain on provocative testing of the hip. On his LEFT knee shows no swelling. His range is about 0-125 with no tenderness or instability. His RIGHT knee shows no effusion. A slight varus deformity. His marked crepitus on range of motion range 5-120. He is tender medial greater than lateral with no instability noted. Pulses sensation and motor are intact both lower extremities. He has an antalgic gait pattern on the RIGHT.  Review of radiographs AP and lateral both knees show the prosthesis on the LEFT in excellent position with no periprosthetic  abnormalities. On the RIGHT he has bone-on-bone arthritis in the medial and patellofemoral compartments.   Assessment & Plan  Status post total left knee replacement (Z61.096) Primary osteoarthritis of right  knee (M17.11)  Note:Surgical Plans: Right Total Knee Replacement  Disposition: Home, Straight to outpatient therapy at [facility]  PCP: Dr. Joseph Art Cards: Dr. Annia Friendly  Topical TXA  Anesthesia Issues: None  Patient was instructed on what medications to stop prior to surgery.  Signed electronically by Lauraine Rinne, III PA-C

## 2016-11-28 ENCOUNTER — Encounter (HOSPITAL_COMMUNITY): Payer: Self-pay | Admitting: Orthopedic Surgery

## 2016-11-28 LAB — CBC
HEMATOCRIT: 31 % — AB (ref 39.0–52.0)
Hemoglobin: 10.2 g/dL — ABNORMAL LOW (ref 13.0–17.0)
MCH: 27.9 pg (ref 26.0–34.0)
MCHC: 32.9 g/dL (ref 30.0–36.0)
MCV: 84.7 fL (ref 78.0–100.0)
PLATELETS: 230 10*3/uL (ref 150–400)
RBC: 3.66 MIL/uL — ABNORMAL LOW (ref 4.22–5.81)
RDW: 13.7 % (ref 11.5–15.5)
WBC: 9.9 10*3/uL (ref 4.0–10.5)

## 2016-11-28 LAB — BASIC METABOLIC PANEL
ANION GAP: 9 (ref 5–15)
BUN: 19 mg/dL (ref 6–20)
CALCIUM: 9.1 mg/dL (ref 8.9–10.3)
CO2: 24 mmol/L (ref 22–32)
Chloride: 102 mmol/L (ref 101–111)
Creatinine, Ser: 1.16 mg/dL (ref 0.61–1.24)
GFR calc Af Amer: >60 mL/min (ref >60)
GFR, EST NON AFRICAN AMERICAN: 59 mL/min — AB (ref >60)
GLUCOSE: 206 mg/dL — AB (ref 65–99)
Potassium: 4.2 mmol/L (ref 3.5–5.1)
Sodium: 135 mmol/L (ref 135–145)

## 2016-11-28 MED ORDER — TRAMADOL HCL 50 MG PO TABS: 50.0000 mg | ORAL_TABLET | Freq: Four times a day (QID) | ORAL | 0 refills | Status: DC | PRN

## 2016-11-28 MED ORDER — METHOCARBAMOL 500 MG PO TABS: 500.0000 mg | ORAL_TABLET | Freq: Four times a day (QID) | ORAL | 0 refills | Status: DC | PRN

## 2016-11-28 MED ORDER — OXYCODONE HCL 5 MG PO TABS: 5.0000 mg | ORAL_TABLET | ORAL | 0 refills | Status: DC | PRN

## 2016-11-28 NOTE — Progress Notes (Signed)
Orthopedic Tech Progress Note Patient Details:  Donald Hernandez 04/02/1940 [id number]  CPM Right Knee CPM Right Knee: On Right Knee Flexion (Degrees): 40 Right Knee Extension (Degrees): 10 Additional Comments: 4 hours per day   [name] 11/28/2016, 2:32 PM

## 2016-11-28 NOTE — Progress Notes (Signed)
Physical Therapy Treatment Patient Details Name: Donald Hernandez MRN: [id number] DOB: 02-16-1941 Today's Date: 11/28/2016    History of Present Illness s/p R TKA; PMHx: R TSA, L TKA    PT Comments    progressing well, pain controlled and pt states he feels well;  Reports (aborted) fall earlier in bathroom with NT, lost balance stepping backward and states he feels like his knee "gave way" (KI in place per pt); encourged pt to wear KI at home initially; knee AROM ~ 15* to 70*; will see in am prior to d/c  Follow Up Recommendations  DC plan and follow up therapy as arranged by surgeon     Equipment Recommendations  Rolling walker with 5" wheels    Recommendations for Other Services       Precautions / Restrictions Precautions Precautions: Fall;Knee Precaution Comments: KI kept in place d/t 15* quad lag Required Braces or Orthoses: Knee Immobilizer - Right Knee Immobilizer - Right: Discontinue once straight leg raise with < 10 degree lag Restrictions Weight Bearing Restrictions: No Other Position/Activity Restrictions: WBAT    Mobility  Bed Mobility Overal bed mobility: Needs Assistance Bed Mobility: Sit to Supine       Sit to supine: Supervision   General bed mobility comments: for safety  Transfers Overall transfer level: Needs assistance Equipment used: Rolling walker (2 wheeled) Transfers: Sit to/from Stand Sit to Stand: Min guard         General transfer comment: cues for hand placement and RLE position  Ambulation/Gait Ambulation/Gait assistance: Min guard Ambulation Distance (Feet): 125 Feet Assistive device: Rolling walker (2 wheeled) Gait Pattern/deviations: Step-to pattern;Antalgic;Trunk flexed     General Gait Details: cues for RW position, posture and step length   Stairs            Wheelchair Mobility    Modified Rankin (Stroke Patients Only)       Balance                                             Cognition Arousal/Alertness: Awake/alert Behavior During Therapy: WFL for tasks assessed/performed Overall Cognitive Status: Within Functional Limits for tasks assessed                                        Exercises Total Joint Exercises Ankle Circles/Pumps: AROM;Both;10 reps Quad Sets: 10 reps;Both;AROM Heel Slides: AROM;AAROM;Right;10 reps Hip ABduction/ADduction: AAROM;Right;10 reps Straight Leg Raises: AROM;Strengthening;Right;10 reps    General Comments        Pertinent Vitals/Pain Pain Assessment: 0-10 Pain Score: 3  Pain Location: right knee Pain Descriptors / Indicators: Sore Pain Intervention(s): Limited activity within patient's tolerance;Monitored during session;Ice applied;Repositioned    Home Living                      Prior Function            PT Goals (current goals can now be found in the care plan section) Acute Rehab PT Goals Patient Stated Goal: to be able to travel to see family for Thanksgiving  PT Goal Formulation: With patient Time For Goal Achievement: 12/01/16 Potential to Achieve Goals: Good Progress towards PT goals: Progressing toward goals    Frequency    7X/week      PT  Plan Current plan remains appropriate    Co-evaluation              AM-PAC PT "6 Clicks" Daily Activity  Outcome Measure  Difficulty turning over in bed (including adjusting bedclothes, sheets and blankets)?: Unable Difficulty moving from lying on back to sitting on the side of the bed? : Unable Difficulty sitting down on and standing up from a chair with arms (e.g., wheelchair, bedside commode, etc,.)?: Unable Help needed moving to and from a bed to chair (including a wheelchair)?: A Little Help needed walking in hospital room?: A Little Help needed climbing 3-5 steps with a railing? : A Lot 6 Click Score: 11    End of Session Equipment Utilized During Treatment: Gait belt;Right knee immobilizer Activity Tolerance:  Patient tolerated treatment well Patient left: in bed;with call bell/phone within reach;with bed alarm set;with family/visitor present   PT Visit Diagnosis: Difficulty in walking, not elsewhere classified (R26.2);Pain Pain - Right/Left: Right Pain - part of body: Knee     Time: 1325-1350 PT Time Calculation (min) (ACUTE ONLY): 25 min  Charges:  $Gait Training: 8-22 mins $Therapeutic Exercise: 8-22 mins                    G Codes:          Katoya Amato 12-28-2016, 2:29 PM

## 2016-11-28 NOTE — Progress Notes (Signed)
   Subjective: 1 Day Post-Op Procedure(s) (LRB): RIGHT TOTAL KNEE ARTHROPLASTY (Right) Patient reports pain as moderate.   Patient seen in rounds for Dr. Lequita Halt.  Patient reports difficulty sleeping due to reported PTSD. Difficulty sleeping is normal for this patient.  Patient is well, but has had some minor complaints of pain in the knee, requiring pain medications We will start therapy today.  Plan is to go Home after hospital stay.  Objective: Vital signs in last 24 hours: Temp:  [97.5 F (36.4 C)-98.3 F (36.8 C)] 98 F (36.7 C) (10/09 0559) Pulse Rate:  [45-74] 74 (10/09 0559) Resp:  [12-19] 16 (10/09 0559) BP: (116-174)/(55-84) 134/68 (10/09 0559) SpO2:  [96 %-100 %] 98 % (10/09 0559) Weight:  [94.3 kg (208 lb)] 94.3 kg (208 lb) (10/08 1624)  Intake/Output from previous day:  Intake/Output Summary (Last 24 hours) at 11/28/16 0926 Last data filed at 11/28/16 0600  Gross per 24 hour  Intake             2870 ml  Output           1077.5 ml  Net           1792.5 ml    Intake/Output this shift: No intake/output data recorded.  Labs:  Recent Labs  11/28/16 0623  HGB 10.2*    Recent Labs  11/28/16 0623  WBC 9.9  RBC 3.66*  HCT 31.0*  PLT 230    Recent Labs  11/28/16 0623  NA 135  K 4.2  CL 102  CO2 24  BUN 19  CREATININE 1.16  GLUCOSE 206*  CALCIUM 9.1   No results for input(s): LABPT, INR in the last 72 hours.  EXAM General - Patient is Alert, Appropriate and Oriented Extremity - Sensation intact distally Intact pulses distally Dorsiflexion/Plantar flexion intact Dressing - dressing C/D/I Motor Function - intact, moving foot and toes well on exam.  Hemovac pulled without difficulty.  Past Medical History:  Diagnosis Date  . Anxiety    PTSD-but not on any meds  . Arthritis   . Cataracts, bilateral    immature  . Diabetes mellitus without complication (HCC)    takes Metformin daily  . Ear infection 2 wks ago   treated with  Amoxicillin;completed Amoxicillin 01/19/14  . GERD (gastroesophageal reflux disease)    takes Omeprazole daily  . History of colon polyps    benign  . Hyperlipidemia    takes SImvastatin daily  . Hypertension    takes Losartan daily  . Insomnia    doesn't take any meds  . Joint pain   . Joint swelling   . Peripheral neuropathy    takes Gabapentin daily    Assessment/Plan: 1 Day Post-Op Procedure(s) (LRB): RIGHT TOTAL KNEE ARTHROPLASTY (Right) Principal Problem:   OA (osteoarthritis) of knee  Estimated body mass index is 30.72 kg/m as calculated from the following:   Height as of this encounter:  (1.753 m).   Weight as of this encounter: 94.3 kg (208 lb). Up with therapy Plan for discharge tomorrow  DVT Prophylaxis - Xarelto and TED hose Weight-Bearing as tolerated to right leg D/C O2 and Pulse OX and try on Room Air  Avel Peace, PA-C Orthopaedic Surgery 11/28/2016, 9:26 AM

## 2016-11-28 NOTE — Evaluation (Signed)
Physical Therapy Evaluation Patient Details Name: Donald Hernandez MRN: [id number] DOB: [birth year] Today's Date: 11/28/2016   History of Present Illness  s/p R TKA; PMHx: R TSA, L TKA  Clinical Impression  Pt is s/p TKA resulting in the deficits listed below (see PT Problem List). * Pt will benefit from skilled PT to increase their independence and safety with mobility to allow discharge to the venue listed below.      Follow Up Recommendations DC plan and follow up therapy as arranged by surgeon    Equipment Recommendations  Rolling walker with 5" wheels    Recommendations for Other Services       Precautions / Restrictions Precautions Precautions: Fall;Knee Required Braces or Orthoses: Knee Immobilizer - Right Knee Immobilizer - Right: Discontinue once straight leg raise with < 10 degree lag Restrictions Weight Bearing Restrictions: No Other Position/Activity Restrictions: WBAT      Mobility  Bed Mobility Overal bed mobility: Needs Assistance Bed Mobility: Supine to Sit     Supine to sit: Min guard;HOB elevated     General bed mobility comments: in chair  Transfers Overall transfer level: Needs assistance Equipment used: Rolling walker (2 wheeled) Transfers: Sit to/from Stand Sit to Stand: Min assist         General transfer comment: cues for hand placement and RLE position  Ambulation/Gait Ambulation/Gait assistance: Min assist Ambulation Distance (Feet): 120 Feet Assistive device: Rolling walker (2 wheeled) Gait Pattern/deviations: Step-to pattern;Antalgic;Trunk flexed     General Gait Details: cues for RW position, posture and step length  Stairs            Wheelchair Mobility    Modified Rankin (Stroke Patients Only)       Balance Overall balance assessment: Needs assistance Sitting-balance support: Feet supported Sitting balance-Leahy Scale: Good     Standing balance support: During functional activity;Bilateral upper extremity  supported Standing balance-Leahy Scale: Fair Standing balance comment: maintains static standing at sink for grooming ADLs with close guard for safety                              Pertinent Vitals/Pain Pain Assessment: 0-10 Pain Score: 5  Pain Location: right knee Pain Descriptors / Indicators: Sore Pain Intervention(s): Limited activity within patient's tolerance;Monitored during session;Premedicated before session;Ice applied    Home Living Family/patient expects to be discharged to:: Private residence Living Arrangements: Spouse/significant other Available Help at Discharge: Available PRN/intermittently Type of Home: House Home Access: Stairs to enter Entrance Stairs-Rails: None Entrance Stairs-Number of Steps: 3 Home Layout: One level Home Equipment: Environmental consultant - 4 wheels      Prior Function Level of Independence: Independent               Hand Dominance        Extremity/Trunk Assessment   Upper Extremity Assessment Upper Extremity Assessment: Overall WFL for tasks assessed    Lower Extremity Assessment Lower Extremity Assessment: RLE deficits/detail RLE Deficits / Details: ankle grossly WFL; knee extension and hip flexion 3/5       Communication   Communication: No difficulties  Cognition Arousal/Alertness: Awake/alert Behavior During Therapy: WFL for tasks assessed/performed Overall Cognitive Status: Within Functional Limits for tasks assessed                                        General Comments  Exercises Total Joint Exercises Ankle Circles/Pumps: AROM;Both;10 reps Quad Sets: 10 reps;Both;AROM   Assessment/Plan    PT Assessment Patient needs continued PT services  PT Problem List Decreased strength;Decreased range of motion;Decreased activity tolerance;Decreased balance;Decreased mobility;Decreased knowledge of use of DME;Pain       PT Treatment Interventions DME instruction;Gait training;Stair  training;Functional mobility training;Therapeutic activities;Therapeutic exercise;Patient/family education    PT Goals (Current goals can be found in the Care Plan section)  Acute Rehab PT Goals Patient Stated Goal: to be able to travel to see family for Thanksgiving  PT Goal Formulation: With patient Time For Goal Achievement: 12/01/16 Potential to Achieve Goals: Good    Frequency 7X/week   Barriers to discharge        Co-evaluation               AM-PAC PT "6 Clicks" Daily Activity  Outcome Measure Difficulty turning over in bed (including adjusting bedclothes, sheets and blankets)?: Unable Difficulty moving from lying on back to sitting on the side of the bed? : Unable Difficulty sitting down on and standing up from a chair with arms (e.g., wheelchair, bedside commode, etc,.)?: Unable Help needed moving to and from a bed to chair (including a wheelchair)?: A Little Help needed walking in hospital room?: A Little Help needed climbing 3-5 steps with a railing? : A Lot 6 Click Score: 11    End of Session Equipment Utilized During Treatment: Gait belt;Right knee immobilizer Activity Tolerance: Patient tolerated treatment well Patient left: with call bell/phone within reach;in chair;with chair alarm set   PT Visit Diagnosis: Difficulty in walking, not elsewhere classified (R26.2);Pain Pain - Right/Left: Right Pain - part of body: Knee    Time: 1610-9604 PT Time Calculation (min) (ACUTE ONLY): 23 min   Charges:   PT Evaluation $PT Eval Low Complexity: 1 Low PT Treatments $Gait Training: 8-22 mins   PT G Codes:          Donald Hernandez 23-Dec-2016, 10:38 AM

## 2016-11-28 NOTE — Progress Notes (Signed)
Pt will need to get DME from the [place]. Pt and wife are aware.  Pt will have OP PT at office.

## 2016-11-28 NOTE — Discharge Summary (Signed)
Physician Discharge Summary   Patient ID: Donald Hernandez MRN: [id number] DOB/AGE: 02-22-1940 76 y.o.  Admit date: 11/27/2016 Discharge date: 10/102018  Primary Diagnosis:  Osteoarthritis  Right knee(s) Admission Diagnoses:  Past Medical History:  Diagnosis Date  . Anxiety    PTSD-but not on any meds  . Arthritis   . Cataracts, bilateral    immature  . Diabetes mellitus without complication ([place])    takes Metformin daily  . Ear infection 2 wks ago   treated with Amoxicillin;completed Amoxicillin 01/19/14  . GERD (gastroesophageal reflux disease)    takes Omeprazole daily  . History of colon polyps    benign  . Hyperlipidemia    takes SImvastatin daily  . Hypertension    takes Losartan daily  . Insomnia    doesn't take any meds  . Joint pain   . Joint swelling   . Peripheral neuropathy    takes Gabapentin daily   Discharge Diagnoses:   Principal Problem:   OA (osteoarthritis) of knee  Estimated body mass index is 30.72 kg/m as calculated from the following:   Height as of this encounter: '5\' 9"'$  (1.753 m).   Weight as of this encounter: 94.3 kg (208 lb).  Procedure:  Procedure(s) (LRB): RIGHT TOTAL KNEE ARTHROPLASTY (Right)   Consults: None  HPI: Donald Hernandez is a 76 y.o. year old male with end stage OA of his right knee with progressively worsening pain and dysfunction. He has constant pain, with activity and at rest and significant functional deficits with difficulties even with ADLs. He has had extensive non-op management including analgesics, injections of cortisone, and home exercise program, but remains in significant pain with significant dysfunction. Radiographs show bone on bone arthritis medial and patellofemoral. He presents now for right Total Knee Arthroplasty.   Laboratory Data: Admission on 11/27/2016  Component Date Value Ref Range Status  . Glucose-Capillary 11/27/2016 80  65 - 99 mg/dL Final  . WBC 11/28/2016 9.9  4.0 - 10.5 K/uL Final  .  RBC 11/28/2016 3.66* 4.22 - 5.81 MIL/uL Final  . Hemoglobin 11/28/2016 10.2* 13.0 - 17.0 g/dL Final  . HCT 11/28/2016 31.0* 39.0 - 52.0 % Final  . MCV 11/28/2016 84.7  78.0 - 100.0 fL Final  . MCH 11/28/2016 27.9  26.0 - 34.0 pg Final  . MCHC 11/28/2016 32.9  30.0 - 36.0 g/dL Final  . RDW 11/28/2016 13.7  11.5 - 15.5 % Final  . Platelets 11/28/2016 230  150 - 400 K/uL Final  . Sodium 11/28/2016 135  135 - 145 mmol/L Final  . Potassium 11/28/2016 4.2  3.5 - 5.1 mmol/L Final  . Chloride 11/28/2016 102  101 - 111 mmol/L Final  . CO2 11/28/2016 24  22 - 32 mmol/L Final  . Glucose, Bld 11/28/2016 206* 65 - 99 mg/dL Final  . BUN 11/28/2016 19  6 - 20 mg/dL Final  . Creatinine, Ser 11/28/2016 1.16  0.61 - 1.24 mg/dL Final  . Calcium 11/28/2016 9.1  8.9 - 10.3 mg/dL Final  . GFR calc non Af Amer 11/28/2016 59* >60 mL/min Final  . GFR calc Af Amer 11/28/2016 >60  >60 mL/min Final   Comment: (NOTE) The eGFR has been calculated using the CKD EPI equation. This calculation has not been validated in all clinical situations. eGFR's persistently <60 mL/min signify possible Chronic Kidney Disease.   . Anion gap 11/28/2016 9  5 - 15 Final  . WBC 11/29/2016 8.5  4.0 - 10.5 K/uL Final  .  RBC 11/29/2016 3.53* 4.22 - 5.81 MIL/uL Final  . Hemoglobin 11/29/2016 9.8* 13.0 - 17.0 g/dL Final  . HCT 11/29/2016 30.0* 39.0 - 52.0 % Final  . MCV 11/29/2016 85.0  78.0 - 100.0 fL Final  . MCH 11/29/2016 27.8  26.0 - 34.0 pg Final  . MCHC 11/29/2016 32.7  30.0 - 36.0 g/dL Final  . RDW 11/29/2016 13.9  11.5 - 15.5 % Final  . Platelets 11/29/2016 219  150 - 400 K/uL Final  . Sodium 11/29/2016 137  135 - 145 mmol/L Final  . Potassium 11/29/2016 4.1  3.5 - 5.1 mmol/L Final  . Chloride 11/29/2016 104  101 - 111 mmol/L Final  . CO2 11/29/2016 24  22 - 32 mmol/L Final  . Glucose, Bld 11/29/2016 143* 65 - 99 mg/dL Final  . BUN 11/29/2016 22* 6 - 20 mg/dL Final  . Creatinine, Ser 11/29/2016 1.40* 0.61 - 1.24 mg/dL  Final  . Calcium 11/29/2016 9.2  8.9 - 10.3 mg/dL Final  . GFR calc non Af Amer 11/29/2016 47* >60 mL/min Final  . GFR calc Af Amer 11/29/2016 55* >60 mL/min Final   Comment: (NOTE) The eGFR has been calculated using the CKD EPI equation. This calculation has not been validated in all clinical situations. eGFR's persistently <60 mL/min signify possible Chronic Kidney Disease.   Georgiann Hahn gap 11/29/2016 9  5 - 15 Final  Hospital Outpatient Visit on 11/21/2016  Component Date Value Ref Range Status  . aPTT 11/21/2016 24  24 - 36 seconds Final  . WBC 11/21/2016 5.2  4.0 - 10.5 K/uL Final  . RBC 11/21/2016 3.94* 4.22 - 5.81 MIL/uL Final  . Hemoglobin 11/21/2016 10.8* 13.0 - 17.0 g/dL Final  . HCT 11/21/2016 33.9* 39.0 - 52.0 % Final  . MCV 11/21/2016 86.0  78.0 - 100.0 fL Final  . MCH 11/21/2016 27.4  26.0 - 34.0 pg Final  . MCHC 11/21/2016 31.9  30.0 - 36.0 g/dL Final  . RDW 11/21/2016 14.0  11.5 - 15.5 % Final  . Platelets 11/21/2016 218  150 - 400 K/uL Final  . Sodium 11/21/2016 141  135 - 145 mmol/L Final  . Potassium 11/21/2016 4.3  3.5 - 5.1 mmol/L Final  . Chloride 11/21/2016 108  101 - 111 mmol/L Final  . CO2 11/21/2016 26  22 - 32 mmol/L Final  . Glucose, Bld 11/21/2016 97  65 - 99 mg/dL Final  . BUN 11/21/2016 27* 6 - 20 mg/dL Final  . Creatinine, Ser 11/21/2016 1.24  0.61 - 1.24 mg/dL Final  . Calcium 11/21/2016 9.5  8.9 - 10.3 mg/dL Final  . Total Protein 11/21/2016 7.0  6.5 - 8.1 g/dL Final  . Albumin 11/21/2016 3.9  3.5 - 5.0 g/dL Final  . AST 11/21/2016 21  15 - 41 U/L Final  . ALT 11/21/2016 15* 17 - 63 U/L Final  . Alkaline Phosphatase 11/21/2016 101  38 - 126 U/L Final  . Total Bilirubin 11/21/2016 0.7  0.3 - 1.2 mg/dL Final  . GFR calc non Af Amer 11/21/2016 55* >60 mL/min Final  . GFR calc Af Amer 11/21/2016 >60  >60 mL/min Final   Comment: (NOTE) The eGFR has been calculated using the CKD EPI equation. This calculation has not been validated in all clinical  situations. eGFR's persistently <60 mL/min signify possible Chronic Kidney Disease.   . Anion gap 11/21/2016 7  5 - 15 Final  . Prothrombin Time 11/21/2016 12.4  11.4 - 15.2 seconds Final  .  INR 11/21/2016 0.93   Final  . ABO/RH(D) 11/21/2016 O POS   Final  . Antibody Screen 11/21/2016 NEG   Final  . Sample Expiration 11/21/2016 11/30/2016   Final  . Extend sample reason 11/21/2016 NO TRANSFUSIONS OR PREGNANCY IN THE PAST 3 MONTHS   Final  . MRSA, PCR 11/21/2016 NEGATIVE  NEGATIVE Final  . Staphylococcus aureus 11/21/2016 NEGATIVE  NEGATIVE Final   Comment: (NOTE) The Xpert SA Assay (FDA approved for NASAL specimens in patients 31 years of age and older), is one component of a comprehensive surveillance program. It is not intended to diagnose infection nor to guide or monitor treatment.   . Hgb A1c MFr Bld 11/21/2016 5.8* 4.8 - 5.6 % Final   Comment: (NOTE) Pre diabetes:          5.7%-6.4% Diabetes:              >6.4% Glycemic control for   <7.0% adults with diabetes   . Mean Plasma Glucose 11/21/2016 119.76  mg/dL Final   Performed at [facility] [address], [place], [place] [address]     X-Rays:No results found.  EKG: Orders placed or performed during the hospital encounter of 01/21/14  . EKG 12 lead  . EKG 12 lead     Hospital Course: Donald Hernandez is a 76 y.o. who was admitted to [facility]. They were brought to the operating room on 11/27/2016 and underwent Procedure(s): RIGHT TOTAL KNEE ARTHROPLASTY.  Patient tolerated the procedure well and was later transferred to the recovery room and then to the orthopaedic floor for postoperative care.  They were given PO and IV analgesics for pain control following their surgery.  They were given 24 hours of postoperative antibiotics of  Anti-infectives    Start     Dose/Rate Route Frequency Ordered Stop   11/27/16 1900  ceFAZolin (ANCEF) IVPB 2g/100 mL premix     2 g 200 mL/hr over 30 Minutes  Intravenous Every 6 hours 11/27/16 1633 11/28/16 0032   11/27/16 1029  ceFAZolin (ANCEF) 2-4 GM/100ML-% IVPB    Comments:  Ronnald Ramp, Sherrie   : cabinet override      11/27/16 1029 11/27/16 1300   11/27/16 1019  ceFAZolin (ANCEF) IVPB 2g/100 mL premix     2 g 200 mL/hr over 30 Minutes Intravenous On call to O.R. 11/27/16 1019 11/27/16 1300     and started on DVT prophylaxis in the form of Xarelto.   PT and OT were ordered for total joint protocol.  Discharge planning consulted to help with postop disposition and equipment needs.  Patient had a decent night on the evening of surgery.  They started to get up OOB with therapy on day one. Hemovac drain was pulled without difficulty.  Continued to work with therapy into day two.  Dressing was changed on day two and the incision was healing well. Patient was seen in rounds and was ready to go home.  Diet - Cardiac diet and Diabetic diet Follow up - in 2 weeks Activity - WBAT Disposition - Home Condition Upon Discharge - Stable D/C Meds - See DC Summary DVT Prophylaxis - Xarelto  Discharge Instructions    Call MD / Call 911    Complete by:  As directed    If you experience chest pain or shortness of breath, CALL 911 and be transported to the hospital emergency room.  If you develope a fever above 101 F, pus (white drainage) or increased  drainage or redness at the wound, or calf pain, call your surgeon's office.   Change dressing    Complete by:  As directed    Change dressing daily with sterile 4 x 4 inch gauze dressing and apply TED hose. Do not submerge the incision under water.   Constipation Prevention    Complete by:  As directed    Drink plenty of fluids.  Prune juice may be helpful.  You may use a stool softener, such as Colace (over the counter) 100 mg twice a day.  Use MiraLax (over the counter) for constipation as needed.   Diet - low sodium heart healthy    Complete by:  As directed    Diet Carb Modified    Complete by:  As directed      Discharge instructions    Complete by:  As directed    Resume the Eliquis at home following discharge  Pick up stool softner and laxative for home use following surgery while on pain medications. Do not submerge incision under water. Please use good hand washing techniques while changing dressing each day. May shower starting three days after surgery. Please use a clean towel to pat the incision dry following showers. Continue to use ice for pain and swelling after surgery. Do not use any lotions or creams on the incision until instructed by your surgeon.  Wear both TED hose on both legs during the day every day for three weeks, but may remove the TED hose at night at home.  Postoperative Constipation Protocol  Constipation - defined medically as fewer than three stools per week and severe constipation as less than one stool per week.  One of the most common issues patients have following surgery is constipation.  Even if you have a regular bowel pattern at home, your normal regimen is likely to be disrupted due to multiple reasons following surgery.  Combination of anesthesia, postoperative narcotics, change in appetite and fluid intake all can affect your bowels.  In order to avoid complications following surgery, here are some recommendations in order to help you during your recovery period.  Colace (docusate) - Pick up an over-the-counter form of Colace or another stool softener and take twice a day as long as you are requiring postoperative pain medications.  Take with a full glass of water daily.  If you experience loose stools or diarrhea, hold the colace until you stool forms back up.  If your symptoms do not get better within 1 week or if they get worse, check with your doctor.  Dulcolax (bisacodyl) - Pick up over-the-counter and take as directed by the product packaging as needed to assist with the movement of your bowels.  Take with a full glass of water.  Use this product as  needed if not relieved by Colace only.   MiraLax (polyethylene glycol) - Pick up over-the-counter to have on hand.  MiraLax is a solution that will increase the amount of water in your bowels to assist with bowel movements.  Take as directed and can mix with a glass of water, juice, soda, coffee, or tea.  Take if you go more than two days without a movement. Do not use MiraLax more than once per day. Call your doctor if you are still constipated or irregular after using this medication for 7 days in a row.  If you continue to have problems with postoperative constipation, please contact the office for further assistance and recommendations.  If you experience "the worst  abdominal pain ever" or develop nausea or vomiting, please contact the office immediatly for further recommendations for treatment.   Do not put a pillow under the knee. Place it under the heel.    Complete by:  As directed    Do not sit on low chairs, stoools or toilet seats, as it may be difficult to get up from low surfaces    Complete by:  As directed    Driving restrictions    Complete by:  As directed    No driving until released by the physician.   Increase activity slowly as tolerated    Complete by:  As directed    Lifting restrictions    Complete by:  As directed    No lifting until released by the physician.   Patient may shower    Complete by:  As directed    You may shower without a dressing once there is no drainage.  Do not wash over the wound.  If drainage remains, do not shower until drainage stops.   TED hose    Complete by:  As directed    Use stockings (TED hose) for 3 weeks on both leg(s).  You may remove them at night for sleeping.   Weight bearing as tolerated    Complete by:  As directed    Laterality:  right   Extremity:  Lower     Allergies as of 11/29/2016   No Known Allergies     Medication List    STOP taking these medications   Cholecalciferol 2000 units Tabs   etodolac 300 MG  capsule Commonly known as:  LODINE   Vitamin D 2000 units tablet     TAKE these medications   acetaminophen 500 MG tablet Commonly known as:  TYLENOL Take 500 mg by mouth 2 (two) times daily.   aspirin EC 81 MG tablet Take 81 mg by mouth daily.   benzonatate 100 MG capsule Commonly known as:  TESSALON Take 100 mg by mouth 2 (two) times daily.   cyclobenzaprine 10 MG tablet Commonly known as:  FLEXERIL Take 1 tablet (10 mg total) by mouth 3 (three) times daily as needed for muscle spasms.   ELIQUIS 5 MG Tabs tablet Generic drug:  apixaban Take 1 tablet by mouth 2 (two) times daily.   fluocinolone 0.01 % external solution Commonly known as:  SYNALAR Apply 1 application topically 2 (two) times daily as needed.   gabapentin 100 MG capsule Commonly known as:  NEURONTIN Take 100 mg by mouth 2 (two) times daily.   ketoconazole 2 % shampoo Commonly known as:  NIZORAL Apply 1 application topically 2 (two) times a week.   ketoconazole 2 % cream Commonly known as:  NIZORAL Apply 1 application topically 2 (two) times daily as needed.   losartan 100 MG tablet Commonly known as:  COZAAR Take 100 mg by mouth daily.   omeprazole 20 MG capsule Commonly known as:  PRILOSEC Take 20 mg by mouth daily.   oxyCODONE 5 MG immediate release tablet Commonly known as:  Oxy IR/ROXICODONE Take 1-2 tablets (5-10 mg total) by mouth every 4 (four) hours as needed for moderate pain or severe pain.   simvastatin 80 MG tablet Commonly known as:  ZOCOR Take 40 mg by mouth at bedtime.   traMADol 50 MG tablet Commonly known as:  ULTRAM Take 1-2 tablets (50-100 mg total) by mouth every 6 (six) hours as needed for moderate pain.  Durable Medical Equipment        Start     Ordered   11/28/16 [phone]  For home use only DME 3 n 1  Once     11/28/16 0953       Discharge Care Instructions        Start     Ordered   11/28/16 0000  Weight bearing as tolerated    Question  Answer Comment  Laterality right   Extremity Lower      11/28/16 2317   11/28/16 0000  Change dressing    Comments:  Change dressing daily with sterile 4 x 4 inch gauze dressing and apply TED hose. Do not submerge the incision under water.   11/28/16 2317     Follow-up Information    Gaynelle Arabian, MD. Schedule an appointment as soon as possible for a visit on 12/12/2016.   Specialty:  Orthopedic Surgery Contact information: [address] [place] [address] [phone]           Signed: Arlee Muslim, PA-C Orthopaedic Surgery 11/29/2016, 7:28 AM

## 2016-11-28 NOTE — Evaluation (Signed)
Occupational Therapy Evaluation Patient Details Name: Donald Hernandez MRN: [id number] DOB: 01-12-1941 Today's Date: 11/28/2016    History of Present Illness s/p R TKA; PMHx: R TSA   Clinical Impression   This 76 y/o M presents with the above. At baseline Pt is independent with ADLs and functional mobility. Pt currently requires MinA-MinGuard for functional mobility at RW level, requires MaxA for LB ADLs secondary to RLE functional limitations. Pt will return home with spouse who is able to provide intermittent assist with ADLs PRN. Will continue to follow acutely to progress Pt's safety and independence with ADLs and functional mobility.     Follow Up Recommendations  DC plan and follow up therapy as arranged by surgeon;Supervision/Assistance - 24 hour (24 hr initially )    Equipment Recommendations  3 in 1 bedside commode           Precautions / Restrictions Precautions Precautions: Fall;Knee Required Braces or Orthoses: Knee Immobilizer - Right Knee Immobilizer - Right: Discontinue once straight leg raise with < 10 degree lag Restrictions Weight Bearing Restrictions: No Other Position/Activity Restrictions: WBAT      Mobility Bed Mobility Overal bed mobility: Needs Assistance Bed Mobility: Supine to Sit     Supine to sit: Min guard;HOB elevated     General bed mobility comments: HOB elevated, close guard for safety, increased time   Transfers Overall transfer level: Needs assistance Equipment used: Rolling walker (2 wheeled) Transfers: Sit to/from Stand Sit to Stand: Mod assist;Min guard         General transfer comment: initially ModA from EOB, progressed to MinGuard from [facility] and recliner, verbal cues for hand/LE placement     Balance Overall balance assessment: Needs assistance Sitting-balance support: Feet supported Sitting balance-Leahy Scale: Good     Standing balance support: During functional activity;Bilateral upper extremity supported Standing  balance-Leahy Scale: Fair Standing balance comment: maintains static standing at sink for grooming ADLs with close guard for safety                            ADL either performed or assessed with clinical judgement   ADL Overall ADL's : Needs assistance/impaired Eating/Feeding: Set up;Sitting   Grooming: Oral care;Wash/dry face;Min guard;Standing   Upper Body Bathing: Min guard;Sitting   Lower Body Bathing: Minimal assistance;Sit to/from stand   Upper Body Dressing : Min guard;Sitting   Lower Body Dressing: Maximal assistance;Sit to/from stand Lower Body Dressing Details (indicate cue type and reason): educated on compensatory technique for completing task  Toilet Transfer: Minimal assistance;Ambulation;BSC;RW Toilet Transfer Details (indicate cue type and reason): BSC over toilet  Toileting- Clothing Manipulation and Hygiene: Minimal assistance;Sit to/from stand Toileting - Clothing Manipulation Details (indicate cue type and reason): Min steady assist in standing while Pt uses urinal      Functional mobility during ADLs: Minimal assistance;Min guard;Rolling walker General ADL Comments: min verbal cues for sequencing and safe RW use                          Pertinent Vitals/Pain Pain Assessment: 0-10 Pain Score: 5  Pain Location: right knee Pain Descriptors / Indicators: Sore Pain Intervention(s): Limited activity within patient's tolerance;Monitored during session;Premedicated before session;Ice applied          Extremity/Trunk Assessment Upper Extremity Assessment Upper Extremity Assessment: Overall WFL for tasks assessed   Lower Extremity Assessment Lower Extremity Assessment: Defer to PT evaluation  Communication Communication Communication: No difficulties   Cognition Arousal/Alertness: Awake/alert Behavior During Therapy: WFL for tasks assessed/performed Overall Cognitive Status: Within Functional Limits for tasks assessed                                                       Home Living Family/patient expects to be discharged to:: Private residence Living Arrangements: Spouse/significant other Available Help at Discharge: Available PRN/intermittently Type of Home: House Home Access: Stairs to enter [employer] of Steps: 3 Entrance Stairs-Rails: None Home Layout: One level     Bathroom Shower/Tub: Tub/shower unit;Walk-in shower   Bathroom Toilet: Standard     Home Equipment: Walker - 4 wheels          Prior Functioning/Environment Level of Independence: Independent                 OT Problem List: Decreased strength;Impaired balance (sitting and/or standing);Decreased knowledge of precautions;Pain;Decreased knowledge of use of DME or AE;Decreased activity tolerance;Decreased range of motion      OT Treatment/Interventions: Self-care/ADL training;DME and/or AE instruction;Therapeutic activities;Balance training;Therapeutic exercise;Energy conservation;Patient/family education    OT Goals(Current goals can be found in the care plan section) Acute Rehab OT Goals Patient Stated Goal: to be able to travel to see family for Thanksgiving  OT Goal Formulation: With patient Time For Goal Achievement: 12/12/16 Potential to Achieve Goals: Good  OT Frequency: Min 2X/week                             AM-PAC PT "6 Clicks" Daily Activity     Outcome Measure Help from another person eating meals?: None Help from another person taking care of personal grooming?: A Little Help from another person toileting, which includes using toliet, bedpan, or urinal?: A Little Help from another person bathing (including washing, rinsing, drying)?: A Little Help from another person to put on and taking off regular upper body clothing?: None Help from another person to put on and taking off regular lower body clothing?: A Lot 6 Click Score: 19   End of Session Equipment  Utilized During Treatment: Gait belt;Rolling walker CPM Right Knee CPM Right Knee: Off  Activity Tolerance: Patient tolerated treatment well Patient left: in chair;with call bell/phone within reach;with chair alarm set;Other (comment) (PT arriving to begin session )  OT Visit Diagnosis: Unsteadiness on feet (R26.81);Pain;Other abnormalities of gait and mobility (R26.89) Pain - Right/Left: Right Pain - part of body: Knee                Time: 0822-0858 OT Time Calculation (min): 36 min Charges:  OT General Charges $OT Visit: 1 Visit OT Evaluation $OT Eval Low Complexity: 1 Low OT Treatments $Self Care/Home Management : 8-22 mins G-Codes:     Marcy Siren, OT Pager [phone] 11/28/2016   Orlando Penner 11/28/2016, 9:43 AM

## 2016-11-28 NOTE — Addendum Note (Signed)
Addendum  created 11/28/16 0602 by Elisabeth Cara, CRNA   Charge Capture section accepted

## 2016-11-28 NOTE — Discharge Instructions (Addendum)
° °Dr. Frank Aluisio °Total Joint Specialist °[facility] °[address] °, [place] [address] °[phone] ° °TOTAL KNEE REPLACEMENT POSTOPERATIVE DIRECTIONS ° °Knee Rehabilitation, Guidelines Following Surgery  °Results after knee surgery are often greatly improved when you follow the exercise, range of motion and muscle strengthening exercises prescribed by your doctor. Safety measures are also important to protect the knee from further injury. Any time any of these exercises cause you to have increased pain or swelling in your knee joint, decrease the amount until you are comfortable again and slowly increase them. If you have problems or questions, call your caregiver or physical therapist for advice.  ° °HOME CARE INSTRUCTIONS  °Remove items at home which could result in a fall. This includes throw rugs or furniture in walking pathways.  °· ICE to the affected knee every three hours for 30 minutes at a time and then as needed for pain and swelling.  Continue to use ice on the knee for pain and swelling from surgery. You may notice swelling that will progress down to the foot and ankle.  This is normal after surgery.  Elevate the leg when you are not up walking on it.   °· Continue to use the breathing machine which will help keep your temperature down.  It is common for your temperature to cycle up and down following surgery, especially at night when you are not up moving around and exerting yourself.  The breathing machine keeps your lungs expanded and your temperature down. °· Do not place pillow under knee, focus on keeping the knee straight while resting ° °DIET °You may resume your previous home diet once your are discharged from the hospital. ° °DRESSING / WOUND CARE / SHOWERING °You may shower 3 days after surgery, but keep the wounds dry during showering.  You may use an occlusive plastic wrap (Press'n Seal for example), NO SOAKING/SUBMERGING IN THE BATHTUB.  If the  bandage gets wet, change with a clean dry gauze.  If the incision gets wet, pat the wound dry with a clean towel. °You may start showering once you are discharged home but do not submerge the incision under water. Just pat the incision dry and apply a dry gauze dressing on daily. °Change the surgical dressing daily and reapply a dry dressing each time. ° °ACTIVITY °Walk with your walker as instructed. °Use walker as long as suggested by your caregivers. °Avoid periods of inactivity such as sitting longer than an hour when not asleep. This helps prevent blood clots.  °You may resume a sexual relationship in one month or when given the OK by your doctor.  °You may return to work once you are cleared by your doctor.  °Do not drive a car for 6 weeks or until released by you surgeon.  °Do not drive while taking narcotics. ° °WEIGHT BEARING °Weight bearing as tolerated with assist device (walker, cane, etc) as directed, use it as long as suggested by your surgeon or therapist, typically at least 4-6 weeks. ° °POSTOPERATIVE CONSTIPATION PROTOCOL °Constipation - defined medically as fewer than three stools per week and severe constipation as less than one stool per week. ° °One of the most common issues patients have following surgery is constipation.  Even if you have a regular bowel pattern at home, your normal regimen is likely to be disrupted due to multiple reasons following surgery.  Combination of anesthesia, postoperative narcotics, change in appetite and fluid intake all can affect your bowels.    In order to avoid complications following surgery, here are some recommendations in order to help you during your recovery period. ° °Colace (docusate) - Pick up an over-the-counter form of Colace or another stool softener and take twice a day as long as you are requiring postoperative pain medications.  Take with a full glass of water daily.  If you experience loose stools or diarrhea, hold the colace until you stool forms  back up.  If your symptoms do not get better within 1 week or if they get worse, check with your doctor. ° °Dulcolax (bisacodyl) - Pick up over-the-counter and take as directed by the product packaging as needed to assist with the movement of your bowels.  Take with a full glass of water.  Use this product as needed if not relieved by Colace only.  ° °MiraLax (polyethylene glycol) - Pick up over-the-counter to have on hand.  MiraLax is a solution that will increase the amount of water in your bowels to assist with bowel movements.  Take as directed and can mix with a glass of water, juice, soda, coffee, or tea.  Take if you go more than two days without a movement. °Do not use MiraLax more than once per day. Call your doctor if you are still constipated or irregular after using this medication for 7 days in a row. ° °If you continue to have problems with postoperative constipation, please contact the office for further assistance and recommendations.  If you experience "the worst abdominal pain ever" or develop nausea or vomiting, please contact the office immediatly for further recommendations for treatment. ° °ITCHING ° If you experience itching with your medications, try taking only a single pain pill, or even half a pain pill at a time.  You can also use Benadryl over the counter for itching or also to help with sleep.  ° °TED HOSE STOCKINGS °Wear the elastic stockings on both legs for three weeks following surgery during the day but you may remove then at night for sleeping. ° °MEDICATIONS °See your medication summary on the “After Visit Summary” that the nursing staff will review with you prior to discharge.  You may have some home medications which will be placed on hold until you complete the course of blood thinner medication.  It is important for you to complete the blood thinner medication as prescribed by your surgeon.  Continue your approved medications as instructed at time of  discharge. ° °PRECAUTIONS °If you experience chest pain or shortness of breath - call 911 immediately for transfer to the hospital emergency department.  °If you develop a fever greater that 101 F, purulent drainage from wound, increased redness or drainage from wound, foul odor from the wound/dressing, or calf pain - CONTACT YOUR SURGEON.   °                                                °FOLLOW-UP APPOINTMENTS °Make sure you keep all of your appointments after your operation with your surgeon and caregivers. You should call the office at the above phone number and make an appointment for approximately two weeks after the date of your surgery or on the date instructed by your surgeon outlined in the "After Visit Summary". ° ° °RANGE OF MOTION AND STRENGTHENING EXERCISES  °Rehabilitation of the knee is important following a knee injury or   an operation. After just a few days of immobilization, the muscles of the thigh which control the knee become weakened and shrink (atrophy). Knee exercises are designed to build up the tone and strength of the thigh muscles and to improve knee motion. Often times heat used for twenty to thirty minutes before working out will loosen up your tissues and help with improving the range of motion but do not use heat for the first two weeks following surgery. These exercises can be done on a training (exercise) mat, on the floor, on a table or on a bed. Use what ever works the best and is most comfortable for you Knee exercises include:  °Leg Lifts - While your knee is still immobilized in a splint or cast, you can do straight leg raises. Lift the leg to 60 degrees, hold for 3 sec, and slowly lower the leg. Repeat 10-20 times 2-3 times daily. Perform this exercise against resistance later as your knee gets better.  °Quad and Hamstring Sets - Tighten up the muscle on the front of the thigh (Quad) and hold for 5-10 sec. Repeat this 10-20 times hourly. Hamstring sets are done by pushing the  foot backward against an object and holding for 5-10 sec. Repeat as with quad sets.  °· Leg Slides: Lying on your back, slowly slide your foot toward your buttocks, bending your knee up off the floor (only go as far as is comfortable). Then slowly slide your foot back down until your leg is flat on the floor again. °· Angel Wings: Lying on your back spread your legs to the side as far apart as you can without causing discomfort.  °A rehabilitation program following serious knee injuries can speed recovery and prevent re-injury in the future due to weakened muscles. Contact your doctor or a physical therapist for more information on knee rehabilitation.  ° °IF YOU ARE TRANSFERRED TO A SKILLED REHAB FACILITY °If the patient is transferred to a skilled rehab facility following release from the hospital, a list of the current medications will be sent to the facility for the patient to continue.  When discharged from the skilled rehab facility, please have the facility set up the patient's Home Health Physical Therapy prior to being released. Also, the skilled facility will be responsible for providing the patient with their medications at time of release from the facility to include their pain medication, the muscle relaxants, and their blood thinner medication. If the patient is still at the rehab facility at time of the two week follow up appointment, the skilled rehab facility will also need to assist the patient in arranging follow up appointment in our office and any transportation needs. ° °MAKE SURE YOU:  °Understand these instructions.  °Get help right away if you are not doing well or get worse.  ° ° °Pick up stool softner and laxative for home use following surgery while on pain medications. °Do not submerge incision under water. °Please use good hand washing techniques while changing dressing each day. °May shower starting three days after surgery. °Please use a clean towel to pat the incision dry following  showers. °Continue to use ice for pain and swelling after surgery. °Do not use any lotions or creams on the incision until instructed by your surgeon. ° °Resume the Eliquis at home following discharge. °

## 2016-11-29 LAB — BASIC METABOLIC PANEL
ANION GAP: 9 (ref 5–15)
BUN: 22 mg/dL — ABNORMAL HIGH (ref 6–20)
CHLORIDE: 104 mmol/L (ref 101–111)
CO2: 24 mmol/L (ref 22–32)
Calcium: 9.2 mg/dL (ref 8.9–10.3)
Creatinine, Ser: 1.4 mg/dL — ABNORMAL HIGH (ref 0.61–1.24)
GFR calc non Af Amer: 47 mL/min — ABNORMAL LOW (ref >60)
GFR, EST AFRICAN AMERICAN: 55 mL/min — AB (ref >60)
GLUCOSE: 143 mg/dL — AB (ref 65–99)
POTASSIUM: 4.1 mmol/L (ref 3.5–5.1)
Sodium: 137 mmol/L (ref 135–145)

## 2016-11-29 LAB — CBC
HEMATOCRIT: 30 % — AB (ref 39.0–52.0)
HEMOGLOBIN: 9.8 g/dL — AB (ref 13.0–17.0)
MCH: 27.8 pg (ref 26.0–34.0)
MCHC: 32.7 g/dL (ref 30.0–36.0)
MCV: 85 fL (ref 78.0–100.0)
Platelets: 219 10*3/uL (ref 150–400)
RBC: 3.53 MIL/uL — AB (ref 4.22–5.81)
RDW: 13.9 % (ref 11.5–15.5)
WBC: 8.5 10*3/uL (ref 4.0–10.5)

## 2016-11-29 MED ORDER — CYCLOBENZAPRINE HCL 10 MG PO TABS
10.0000 mg | ORAL_TABLET | Freq: Three times a day (TID) | ORAL | Status: DC | PRN
  Administered 2016-11-29: 08:00:00 10 mg via ORAL
  Filled 2016-11-29: qty 1

## 2016-11-29 MED ORDER — CYCLOBENZAPRINE HCL 10 MG PO TABS: 10.0000 mg | ORAL_TABLET | Freq: Three times a day (TID) | ORAL | 0 refills | Status: AC | PRN

## 2016-11-29 MED ORDER — TRAMADOL HCL 50 MG PO TABS: 50.0000 mg | ORAL_TABLET | Freq: Four times a day (QID) | ORAL | 0 refills | Status: AC | PRN

## 2016-11-29 MED ORDER — OXYCODONE HCL 5 MG PO TABS: 5.0000 mg | ORAL_TABLET | ORAL | 0 refills | Status: AC | PRN

## 2016-11-29 NOTE — Progress Notes (Signed)
Physical Therapy Treatment Patient Details Name: Donald Hernandez MRN: [id number] DOB: 1940-06-16 Today's Date: 11/29/2016    History of Present Illness s/p R TKA; PMHx: R TSA, L TKA    PT Comments    Spouse present for instruction/safety training on mobility and stairs.    Follow Up Recommendations  DC plan and follow up therapy as arranged by surgeon;Outpatient PT     Equipment Recommendations  Rolling walker with 5" wheels    Recommendations for Other Services       Precautions / Restrictions Precautions Precautions: Fall;Knee Precaution Comments: instructed to wear KI for amb and stairs until able to perform SLR Required Braces or Orthoses: Knee Immobilizer - Right Knee Immobilizer - Right: Discontinue once straight leg raise with < 10 degree lag Restrictions Weight Bearing Restrictions: No Other Position/Activity Restrictions: WBAT    Mobility  Bed Mobility               General bed mobility comments: OOB in recliner  Transfers Overall transfer level: Needs assistance Equipment used: Rolling walker (2 wheeled) Transfers: Sit to/from Stand Sit to Stand: Min guard;Supervision         General transfer comment: 25% for safety:  cues for UE placement  Ambulation/Gait Ambulation/Gait assistance: Supervision;Min guard Ambulation Distance (Feet): 25 Feet Assistive device: Rolling walker (2 wheeled) Gait Pattern/deviations: Step-to pattern;Antalgic;Trunk flexed Gait velocity: decreased   General Gait Details: cues for RW position, posture and step length   Stairs Stairs: Yes   Stair Management: No rails;Backwards;With walker Number of Stairs: 2 General stair comments: up backward due to no rails 2 steps performed twice with spouse present for instruction/training  Wheelchair Mobility    Modified Rankin (Stroke Patients Only)       Balance                                            Cognition Arousal/Alertness:  Awake/alert Behavior During Therapy: WFL for tasks assessed/performed Overall Cognitive Status: Within Functional Limits for tasks assessed                                        Exercises      General Comments        Pertinent Vitals/Pain Pain Assessment: 0-10 Pain Score: 4  Pain Location: right knee Pain Descriptors / Indicators: Sore;Operative site guarding Pain Intervention(s): Monitored during session;Repositioned;Ice applied    Home Living                      Prior Function            PT Goals (current goals can now be found in the care plan section) Progress towards PT goals: Progressing toward goals    Frequency    7X/week      PT Plan Current plan remains appropriate    Co-evaluation              AM-PAC PT "6 Clicks" Daily Activity  Outcome Measure  Difficulty turning over in bed (including adjusting bedclothes, sheets and blankets)?: A Little Difficulty moving from lying on back to sitting on the side of the bed? : A Little Difficulty sitting down on and standing up from a chair with arms (e.g., wheelchair, bedside commode, etc,.)?: A Little Help  needed moving to and from a bed to chair (including a wheelchair)?: A Little Help needed walking in hospital room?: A Little Help needed climbing 3-5 steps with a railing? : A Little 6 Click Score: 18    End of Session Equipment Utilized During Treatment: Gait belt;Right knee immobilizer Activity Tolerance: Patient tolerated treatment well Patient left: in chair;with chair alarm set;with call bell/phone within reach Nurse Communication:  (pt will need another PT session with spouse present to address stairs) PT Visit Diagnosis: Difficulty in walking, not elsewhere classified (R26.2);Pain Pain - Right/Left: Right Pain - part of body: Knee     Time: 1610-9604 PT Time Calculation (min) (ACUTE ONLY): 12 min  Charges:  $Gait Training: 8-22 mins                     G  Codes:       Felecia Shelling  PTA [facility]  Acute  Rehab Pager      [phone]

## 2016-11-29 NOTE — Progress Notes (Addendum)
Occupational Therapy Treatment Patient Details Name: Donald Hernandez MRN: [id number] DOB: Jul 31, 1940 Today's Date: 11/29/2016    History of present illness s/p R TKA; PMHx: R TSA, L TKA   OT comments  Educated on safety; pt needs reinforcement. Plan to practice shower transfer when wife is here  Follow Up Recommendations  Supervision/Assistance - 24 hour    Equipment Recommendations  3 in 1 bedside commode (pt states he has to go through [place])    Recommendations for Other Services      Precautions / Restrictions Precautions Precautions: Fall;Knee Precaution Comments: pt's knee buckled 10/9.  Used KI Restrictions Weight Bearing Restrictions: No Other Position/Activity Restrictions: WBAT       Mobility Bed Mobility               General bed mobility comments: Pt was at EOB with NT  Transfers   Equipment used: Rolling walker (2 wheeled)   Sit to Stand: Min guard         General transfer comment: for safety:  cues for UE placement    Balance                                           ADL either performed or assessed with clinical judgement   ADL Overall ADL's : Needs assistance/impaired     Grooming: Min guard;Standing   Upper Body Bathing: Min guard;Standing               Toilet Transfer: Minimal assistance;Ambulation;BSC;RW Toilet Transfer Details (indicate cue type and reason): BSC over toilet  Toileting- Clothing Manipulation and Hygiene: Min guard;Sit to/from stand         General ADL Comments: ambulated to bathroom, used commode and washed areas he wanted to freshen up standing at sink. Pt wanted to wait to get dressed. Min A walking to bathroom as pt tended to lift walker around obstacles.  Educated on sidestepping and had him practice this     Biochemist, clinical      Cognition Arousal/Alertness: Awake/alert Behavior During Therapy: WFL for tasks assessed/performed Overall Cognitive Status:  Within Functional Limits for tasks assessed                                          Exercises     Shoulder Instructions       General Comments      Pertinent Vitals/ Pain       Pain Score: 3  Pain Location: right knee Pain Descriptors / Indicators: Sore Pain Intervention(s): Limited activity within patient's tolerance;Monitored during session;Repositioned;Ice applied;Premedicated before session (tylenol only)  Home Living                                          Prior Functioning/Environment              Frequency           Progress Toward Goals  OT Goals(current goals can now be found in the care plan section)  Progress towards OT goals     Plan      Co-evaluation  AM-PAC PT "6 Clicks" Daily Activity     Outcome Measure   Help from another person eating meals?: None Help from another person taking care of personal grooming?: A Little Help from another person toileting, which includes using toliet, bedpan, or urinal?: A Little Help from another person bathing (including washing, rinsing, drying)?: A Little Help from another person to put on and taking off regular upper body clothing?: A Little Help from another person to put on and taking off regular lower body clothing?: A Lot 6 Click Score: 18    End of Session CPM Right Knee CPM Right Knee: Off  OT Visit Diagnosis: Unsteadiness on feet (R26.81);Pain;Other abnormalities of gait and mobility (R26.89) Pain - Right/Left: Right Pain - part of body: Knee   Activity Tolerance Patient tolerated treatment well   Patient Left in chair;with call bell/phone within reach;with chair alarm set   Nurse Communication          Time: [phone] OT Time Calculation (min): 20 min  Charges: OT General Charges $OT Visit: 1 Visit OT Treatments $Self Care/Home Management : 8-22 mins  Donald Hernandez,  OTR/L [phone] 11/29/2016   Donald Hernandez 11/29/2016, 11:09 AM

## 2016-11-29 NOTE — Progress Notes (Signed)
Physical Therapy Treatment Patient Details Name: Donald Hernandez MRN: [id number] DOB: December 29, 1940 Today's Date: 11/29/2016    History of Present Illness s/p R TKA; PMHx: R TSA, L TKA    PT Comments    POD # 2 am session Applied KI and instructed pt on use.  Assisted with amb in hallway, practiced stairs then performed all supine TKR TE's following HEP handout.  Instructed on proper tech, freq as well as use of ICE.  Pt will need another PT session with spouse to address stair safety.   Follow Up Recommendations  DC plan and follow up therapy as arranged by surgeon;Outpatient PT     Equipment Recommendations  Rolling walker with 5" wheels    Recommendations for Other Services       Precautions / Restrictions Precautions Precautions: Fall;Knee Precaution Comments: instructed to wear KI for amb and stairs until able to perform SLR Required Braces or Orthoses: Knee Immobilizer - Right Knee Immobilizer - Right: Discontinue once straight leg raise with < 10 degree lag Restrictions Weight Bearing Restrictions: No Other Position/Activity Restrictions: WBAT    Mobility  Bed Mobility               General bed mobility comments: OOB in recliner  Transfers Overall transfer level: Needs assistance Equipment used: Rolling walker (2 wheeled) Transfers: Sit to/from Stand Sit to Stand: Min guard;Supervision         General transfer comment: 25% for safety:  cues for UE placement  Ambulation/Gait Ambulation/Gait assistance: Supervision;Min guard Ambulation Distance (Feet): 85 Feet Assistive device: Rolling walker (2 wheeled) Gait Pattern/deviations: Step-to pattern;Antalgic;Trunk flexed Gait velocity: decreased   General Gait Details: cues for RW position, posture and step length   Stairs Stairs: Yes   Stair Management: No rails;Backwards;With walker Number of Stairs: 2 General stair comments: up backward due to no rails 2 steps performed twice  Wheelchair  Mobility    Modified Rankin (Stroke Patients Only)       Balance                                            Cognition Arousal/Alertness: Awake/alert Behavior During Therapy: WFL for tasks assessed/performed Overall Cognitive Status: Within Functional Limits for tasks assessed                                        Exercises   Total Knee Replacement TE's 10 reps B LE ankle pumps 10 reps towel squeezes 10 reps knee presses 10 reps heel slides  10 reps SAQ's 10 reps SLR's 10 reps ABD Followed by ICE     General Comments        Pertinent Vitals/Pain Pain Assessment: 0-10 Pain Score: 4  Pain Location: right knee Pain Descriptors / Indicators: Sore;Operative site guarding Pain Intervention(s): Monitored during session;Repositioned;Ice applied    Home Living                      Prior Function            PT Goals (current goals can now be found in the care plan section) Progress towards PT goals: Progressing toward goals    Frequency    7X/week      PT Plan Current plan remains appropriate  Co-evaluation              AM-PAC PT "6 Clicks" Daily Activity  Outcome Measure  Difficulty turning over in bed (including adjusting bedclothes, sheets and blankets)?: A Little Difficulty moving from lying on back to sitting on the side of the bed? : A Little Difficulty sitting down on and standing up from a chair with arms (e.g., wheelchair, bedside commode, etc,.)?: A Little Help needed moving to and from a bed to chair (including a wheelchair)?: A Little Help needed walking in hospital room?: A Little Help needed climbing 3-5 steps with a railing? : A Little 6 Click Score: 18    End of Session Equipment Utilized During Treatment: Gait belt;Right knee immobilizer Activity Tolerance: Patient tolerated treatment well Patient left: in chair;with chair alarm set;with call bell/phone within reach Nurse  Communication:  (pt will need another PT session with spouse present to address stairs) PT Visit Diagnosis: Difficulty in walking, not elsewhere classified (R26.2);Pain Pain - Right/Left: Right Pain - part of body: Knee     Time: 1610-9604 PT Time Calculation (min) (ACUTE ONLY): 40 min  Charges:  $Gait Training: 8-22 mins $Therapeutic Exercise: 8-22 mins $Therapeutic Activity: 8-22 mins                    G Codes:       Felecia Shelling  PTA [facility]  Acute  Rehab Pager      [phone]

## 2016-11-29 NOTE — Progress Notes (Signed)
   11/29/16 1111  OT Visit Information  Last OT Received On 11/29/16  Assistance Needed +1  History of Present Illness s/p R TKA; PMHx: R TSA, L TKA  Precautions  Precautions Fall;Knee  Precaution Comments pt's knee buckled 10/9.  Used KI  Required Braces or Orthoses Knee Immobilizer - Right  Pain Assessment  Pain Score 3  Pain Location right knee  Pain Descriptors / Indicators Sore  Pain Intervention(s) Limited activity within patient's tolerance;Monitored during session;Premedicated before session;Repositioned;Ice applied  Cognition  Arousal/Alertness Awake/alert  Behavior During Therapy WFL for tasks assessed/performed  Overall Cognitive Status Within Functional Limits for tasks assessed  ADL  Tub/ Engineer, structural Walk-in shower;Min guard;Ambulation  General ADL Comments pt much safer this session. Wife present.  Pt demonstrated sidestepping through tight spaces.  He plans to get 3:1 through [place].  Educated that he cannot safely shower until he has a shower seat or 3:1 in place and educated to wear KI until he is sitting down, then remove it for shower and replace prior to stepping out.  Showed wife where to stand to guard pt and options for placement of seat, including using RW in shower to get to seat if placed at far end.  Bed Mobility  General bed mobility comments oob  Restrictions  Other Position/Activity Restrictions WBAT  Transfers  Equipment used Rolling walker (2 wheeled)  Sit to Stand Min guard  General transfer comment for safety:  cues for UE placement  OT - End of Session  Activity Tolerance Patient tolerated treatment well  Patient left in chair;with call bell/phone within reach;with chair alarm set  OT Assessment/Plan  OT Visit Diagnosis Unsteadiness on feet (R26.81);Pain;Other abnormalities of gait and mobility (R26.89)  Pain - Right/Left Right  Pain - part of body Knee  OT Frequency (ACUTE ONLY) Min 2X/week  Follow Up Recommendations Supervision/Assistance - 24  hour  OT Equipment 3 in 1 bedside commode  AM-PAC OT "6 Clicks" Daily Activity Outcome Measure  Help from another person eating meals? 4  Help from another person taking care of personal grooming? 3  Help from another person toileting, which includes using toliet, bedpan, or urinal? 3  Help from another person bathing (including washing, rinsing, drying)? 3  Help from another person to put on and taking off regular upper body clothing? 3  Help from another person to put on and taking off regular lower body clothing? 2  6 Click Score 18  ADL G Code Conversion CK  OT Goal Progression  Progress towards OT goals Progressing toward goals  OT Time Calculation  OT Start Time (ACUTE ONLY) 1011  OT Stop Time (ACUTE ONLY) 1023  OT Time Calculation (min) 12 min  OT General Charges  $OT Visit 1 Visit  OT Treatments  $Self Care/Home Management  8-22 mins  all education was completed this session. Marica Otter, OTR/L [phone] 11/29/2016

## 2016-11-29 NOTE — Progress Notes (Signed)
   Subjective: 2 Days Post-Op Procedure(s) (LRB): RIGHT TOTAL KNEE ARTHROPLASTY (Right) Patient reports pain as mild and moderate.   Patient seen in rounds with Dr. Lequita Halt. Patient is well, but has had some minor complaints of pain in the knee, requiring pain medications Patient is ready to go home  Objective: Vital signs in last 24 hours: Temp:  [98.1 F (36.7 C)-98.5 F (36.9 C)] 98.5 F (36.9 C) (10/10 0607) Pulse Rate:  [62-71] 68 (10/10 0800) Resp:  [17-20] 17 (10/10 0607) BP: (120-186)/(54-72) 120/62 (10/10 0800) SpO2:  [96 %-99 %] 98 % (10/10 0607)  Intake/Output from previous day:  Intake/Output Summary (Last 24 hours) at 11/29/16 0808 Last data filed at 11/29/16 0607  Gross per 24 hour  Intake             1920 ml  Output             1200 ml  Net              720 ml    Intake/Output this shift: No intake/output data recorded.  Labs:  Recent Labs  11/28/16 0623 11/29/16 0552  HGB 10.2* 9.8*    Recent Labs  11/28/16 0623 11/29/16 0552  WBC 9.9 8.5  RBC 3.66* 3.53*  HCT 31.0* 30.0*  PLT 230 219    Recent Labs  11/28/16 0623 11/29/16 0552  NA 135 137  K 4.2 4.1  CL 102 104  CO2 24 24  BUN 19 22*  CREATININE 1.16 1.40*  GLUCOSE 206* 143*  CALCIUM 9.1 9.2   No results for input(s): LABPT, INR in the last 72 hours.  EXAM: General - Patient is Alert, Appropriate and Oriented Extremity - Neurovascular intact Sensation intact distally Incision - clean, dry Motor Function - intact, moving foot and toes well on exam.   Assessment/Plan: 2 Days Post-Op Procedure(s) (LRB): RIGHT TOTAL KNEE ARTHROPLASTY (Right) Procedure(s) (LRB): RIGHT TOTAL KNEE ARTHROPLASTY (Right) Past Medical History:  Diagnosis Date  . Anxiety    PTSD-but not on any meds  . Arthritis   . Cataracts, bilateral    immature  . Diabetes mellitus without complication (HCC)    takes Metformin daily  . Ear infection 2 wks ago   treated with Amoxicillin;completed  Amoxicillin 01/19/14  . GERD (gastroesophageal reflux disease)    takes Omeprazole daily  . History of colon polyps    benign  . Hyperlipidemia    takes SImvastatin daily  . Hypertension    takes Losartan daily  . Insomnia    doesn't take any meds  . Joint pain   . Joint swelling   . Peripheral neuropathy    takes Gabapentin daily   Principal Problem:   OA (osteoarthritis) of knee  Estimated body mass index is 30.72 kg/m as calculated from the following:   Height as of this encounter:  (1.753 m).   Weight as of this encounter: 94.3 kg (208 lb). Up with therapy  Diet - Cardiac diet and Diabetic diet Follow up - in 2 weeks Activity - WBAT Disposition - Home Condition Upon Discharge - Stable D/C Meds - See DC Summary DVT Prophylaxis - Xarelto  Avel Peace, PA-C Orthopaedic Surgery 11/29/2016, 8:08 AM

## 2018-08-13 ENCOUNTER — Other Ambulatory Visit: Payer: Self-pay | Admitting: *Deleted

## 2018-08-13 DIAGNOSIS — Z20822 Contact with and (suspected) exposure to covid-19: Secondary | ICD-10-CM

## 2018-08-19 LAB — NOVEL CORONAVIRUS, NAA: SARS-CoV-2, NAA: NOT DETECTED

## 2020-12-29 ENCOUNTER — Other Ambulatory Visit: Payer: Self-pay

## 2020-12-29 ENCOUNTER — Ambulatory Visit: Payer: Self-pay

## 2020-12-29 ENCOUNTER — Ambulatory Visit (INDEPENDENT_AMBULATORY_CARE_PROVIDER_SITE_OTHER): Payer: No Typology Code available for payment source | Admitting: Orthopedic Surgery

## 2020-12-29 DIAGNOSIS — M25511 Pain in right shoulder: Secondary | ICD-10-CM | POA: Diagnosis not present

## 2021-01-01 ENCOUNTER — Encounter: Payer: Self-pay | Admitting: Orthopedic Surgery
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
## 2021-06-29 ENCOUNTER — Ambulatory Visit: Payer: Medicare Other | Admitting: Orthopedic Surgery

## 2021-07-15 ENCOUNTER — Ambulatory Visit: Payer: Medicare Other | Admitting: Orthopedic Surgery

## 2022-06-17 ENCOUNTER — Encounter (HOSPITAL_BASED_OUTPATIENT_CLINIC_OR_DEPARTMENT_OTHER): Payer: Self-pay

## 2022-06-17 ENCOUNTER — Emergency Department (HOSPITAL_BASED_OUTPATIENT_CLINIC_OR_DEPARTMENT_OTHER)
Admission: EM | Admit: 2022-06-17 | Discharge: 2022-06-17 | Disposition: A | Discharge status: Home / Self Care | Payer: No Typology Code available for payment source | Attending: Emergency Medicine | Admitting: Emergency Medicine

## 2022-06-17 ENCOUNTER — Other Ambulatory Visit: Payer: Self-pay

## 2022-06-17 DIAGNOSIS — K146 Glossodynia: Secondary | ICD-10-CM | POA: Insufficient documentation

## 2022-06-17 DIAGNOSIS — E119 Type 2 diabetes mellitus without complications: Secondary | ICD-10-CM | POA: Diagnosis not present

## 2022-06-17 DIAGNOSIS — Z79899 Other long term (current) drug therapy: Secondary | ICD-10-CM | POA: Diagnosis not present

## 2022-06-17 DIAGNOSIS — Z7901 Long term (current) use of anticoagulants: Secondary | ICD-10-CM | POA: Insufficient documentation

## 2022-06-17 DIAGNOSIS — I10 Essential (primary) hypertension: Secondary | ICD-10-CM | POA: Insufficient documentation

## 2022-06-17 DIAGNOSIS — R0981 Nasal congestion: Secondary | ICD-10-CM | POA: Insufficient documentation

## 2022-06-17 DIAGNOSIS — Z7982 Long term (current) use of aspirin: Secondary | ICD-10-CM | POA: Diagnosis not present

## 2022-06-17 NOTE — ED Triage Notes (Signed)
Pt c/o sore throat, numb tongue, pain behind his ears, states this has happened once before a few weeks ago but resolved spontaneously. Today it started approx 2 hours PTA.

## 2022-06-17 NOTE — Discharge Instructions (Addendum)
Your EKG today was normal.  Your tongue may feel like this because of something that was in the air today.  Avoid being outside around a bunch of grass or pollen tomorrow.  Continue to drink plenty of fluids.  If you start having swelling of your tongue/face/throat, trouble breathing or severe pain in your throat return to the emergency room

## 2022-06-17 NOTE — ED Notes (Signed)
Pt ambulates to the bathroom to void.  

## 2022-06-17 NOTE — ED Provider Notes (Signed)
[place] EMERGENCY DEPARTMENT AT [facility] Provider Note   CSN: [id number] Arrival date & time: 06/17/22  1955     History  Chief Complaint  Patient presents with   Sore Throat    Donald Hernandez is a 82 y.o. male.  Patient is an 82 year old male with a history of hypertension, hyperlipidemia, GERD, diabetes, peripheral neuropathy who is presenting today with complaint of his tongue burning and tingling.  He reports this started this afternoon after he had mowed for 4 hours by pushing his lawn more over 3 acres.  He reports feeling fine while he was doing the mowing it was after he came in.  He also noticed he had burning in his ears and his nose seemed clogged and runny.  He had no difficulty tasting and was able to swallow without difficulty.  He states that he drank 2 glasses of water which seemed to help the symptoms and it seems to be getting better but is still present.  He denies any fever, cough or shortness of breath.  He also notes that after doing this today he has some soreness in both of his shoulders.  While he was mowing he felt completely normal.  The history is provided by the patient.  Sore Throat       Home Medications Prior to Admission medications   Medication Sig Start Date End Date Taking? Authorizing Provider  acetaminophen (TYLENOL) 500 MG tablet Take 500 mg by mouth 2 (two) times daily.    [provider]  apixaban (ELIQUIS) 5 MG TABS tablet Take 1 tablet by mouth 2 (two) times daily.     [provider]  aspirin EC 81 MG tablet Take 81 mg by mouth daily.    [provider]  benzonatate (TESSALON) 100 MG capsule Take 100 mg by mouth 2 (two) times daily.    [provider]  cyclobenzaprine (FLEXERIL) 10 MG tablet Take 1 tablet (10 mg total) by mouth 3 (three) times daily as needed for muscle spasms. 11/29/16   Perkins, Alexzandrew L, PA-C  fluocinolone (SYNALAR) 0.01 % external solution Apply 1 application  topically 2 (two) times daily as needed.    [provider]  gabapentin (NEURONTIN) 100 MG capsule Take 100 mg by mouth 2 (two) times daily.     [provider]  ketoconazole (NIZORAL) 2 % cream Apply 1 application topically 2 (two) times daily as needed.    [provider]  ketoconazole (NIZORAL) 2 % shampoo Apply 1 application topically 2 (two) times a week.    [provider]  losartan (COZAAR) 100 MG tablet Take 100 mg by mouth daily.    [provider]  omeprazole (PRILOSEC) 20 MG capsule Take 20 mg by mouth daily.    [provider]  oxyCODONE (OXY IR/ROXICODONE) 5 MG immediate release tablet Take 1-2 tablets (5-10 mg total) by mouth every 4 (four) hours as needed for moderate pain or severe pain. 11/29/16   Perkins, Alexzandrew L, PA-C  simvastatin (ZOCOR) 80 MG tablet Take 40 mg by mouth at bedtime.    [provider]  traMADol (ULTRAM) 50 MG tablet Take 1-2 tablets (50-100 mg total) by mouth every 6 (six) hours as needed for moderate pain. 11/29/16   Perkins, Alexzandrew L, PA-C      Allergies    Patient has no known allergies.    Review of Systems   Review of Systems  Physical Exam Updated Vital Signs BP (!) 166/65  Pulse 74   Temp 98.7 F (37.1 C)   Resp (!) 24   Ht 5\' 10"  (1.778 m)   Wt 91 kg   SpO2 98%   BMI 28.78 kg/m  Physical Exam Vitals and nursing note reviewed.  Constitutional:      General: He is not in acute distress.    Appearance: He is well-developed.  HENT:     Head: Normocephalic and atraumatic.     Right Ear: Tympanic membrane normal.     Left Ear: Tympanic membrane normal.     Nose: Congestion present.     Mouth/Throat:     Mouth: Mucous membranes are moist.     Tongue: No lesions. Tongue does not deviate from midline.     Palate: No mass.     Pharynx: Oropharynx is clear. No pharyngeal swelling, oropharyngeal exudate, posterior oropharyngeal erythema or uvula swelling.      Comments: No frank edema, induration or tenderness with palpation of the tongue.  Pharynx is normal Eyes:     Conjunctiva/sclera: Conjunctivae normal.     Pupils: Pupils are equal, round, and reactive to light.  Cardiovascular:     Rate and Rhythm: Normal rate and regular rhythm.     Heart sounds: No murmur heard. Pulmonary:     Effort: Pulmonary effort is normal. No respiratory distress.     Breath sounds: Normal breath sounds. No wheezing or rales.  Abdominal:     General: There is no distension.     Palpations: Abdomen is soft.     Tenderness: There is no abdominal tenderness. There is no guarding or rebound.  Musculoskeletal:        General: No tenderness. Normal range of motion.     Cervical back: Normal range of motion and neck supple.  Lymphadenopathy:     Cervical: No cervical adenopathy.  Skin:    General: Skin is warm and dry.     Findings: No erythema or rash.  Neurological:     Mental Status: He is alert and oriented to person, place, and time. Mental status is at baseline.  Psychiatric:        Behavior: Behavior normal.     ED Results / Procedures / Treatments   Labs (all labs ordered are listed, but only abnormal results are displayed) Labs Reviewed - No data to display  EKG None  Radiology No results found.  Procedures Procedures    Medications Ordered in ED Medications - No data to display  ED Course/ Medical Decision Making/ A&P                             Medical Decision Making Amount and/or Complexity of Data Reviewed ECG/medicine tests: ordered and independent interpretation performed. Decision-making details documented in ED Course.   Here today complaining of a tingling sensation in his tongue and burning in his ears.  Suspect patient's symptoms are from being outside all day today and possible allergen in the air.  He has no evidence of anaphylactic reaction or swelling in his throat or tongue.  No stridor or change in voice.  He has  been able to swallow without difficulty.  He is ambulating without problems.  He was able to eat and drink after the symptoms occurred and reports they are starting to feel better.  No symptoms to suggest strokelike symptoms.  No evidence to suggest infectious etiology such as parotitis, PTA, epiglottitis or RPA.  No swelling  noted in the throat no trismus or stridor.  I independently interpreted patient's EKG without acute findings.  At this time patient appears stable for discharge.  Instructed him to avoid being outside tomorrow and return if he starts noticing any swelling or difficulty eating.        Final Clinical Impression(s) / ED Diagnoses Final diagnoses:  Painful tongue    Rx / DC Orders ED Discharge Orders     None         Gwyneth Sprout, MD 06/17/22 2208
